# Patient Record
Sex: Female | Born: 1950
Health system: Southern US, Community
[De-identification: ages and names within clinical notes are randomized; demographics above are authoritative.]

## PROBLEM LIST (undated history)

## (undated) DIAGNOSIS — I10 Essential (primary) hypertension: Secondary | ICD-10-CM

## (undated) DIAGNOSIS — F32A Depression, unspecified: Secondary | ICD-10-CM

## (undated) DIAGNOSIS — F419 Anxiety disorder, unspecified: Secondary | ICD-10-CM

## (undated) DIAGNOSIS — G4730 Sleep apnea, unspecified: Secondary | ICD-10-CM

## (undated) DIAGNOSIS — G43909 Migraine, unspecified, not intractable, without status migrainosus: Secondary | ICD-10-CM

## (undated) DIAGNOSIS — K589 Irritable bowel syndrome without diarrhea: Secondary | ICD-10-CM

## (undated) DIAGNOSIS — M1990 Unspecified osteoarthritis, unspecified site: Secondary | ICD-10-CM

## (undated) DIAGNOSIS — J45909 Unspecified asthma, uncomplicated: Secondary | ICD-10-CM

## (undated) DIAGNOSIS — E7800 Pure hypercholesterolemia, unspecified: Secondary | ICD-10-CM

## (undated) DIAGNOSIS — R011 Cardiac murmur, unspecified: Secondary | ICD-10-CM

## (undated) DIAGNOSIS — E119 Type 2 diabetes mellitus without complications: Secondary | ICD-10-CM

## (undated) DIAGNOSIS — K2270 Barrett's esophagus without dysplasia: Secondary | ICD-10-CM

## (undated) DIAGNOSIS — F329 Major depressive disorder, single episode, unspecified: Secondary | ICD-10-CM

## (undated) DIAGNOSIS — J302 Other seasonal allergic rhinitis: Secondary | ICD-10-CM

## (undated) DIAGNOSIS — G629 Polyneuropathy, unspecified: Secondary | ICD-10-CM

## (undated) DIAGNOSIS — M8580 Other specified disorders of bone density and structure, unspecified site: Secondary | ICD-10-CM

## (undated) DIAGNOSIS — K219 Gastro-esophageal reflux disease without esophagitis: Secondary | ICD-10-CM

## (undated) HISTORY — PX: TUBAL LIGATION: SHX77

## (undated) HISTORY — PX: KNEE SURGERY: SHX244

## (undated) HISTORY — DX: Unspecified asthma, uncomplicated: J45.909

## (undated) HISTORY — DX: Gastro-esophageal reflux disease without esophagitis: K21.9

## (undated) HISTORY — DX: Essential (primary) hypertension: I10

## (undated) HISTORY — DX: Other seasonal allergic rhinitis: J30.2

## (undated) HISTORY — DX: Polyneuropathy, unspecified: G62.9

## (undated) HISTORY — DX: Migraine, unspecified, not intractable, without status migrainosus: G43.909

## (undated) HISTORY — DX: Anxiety disorder, unspecified: F41.9

## (undated) HISTORY — DX: Other specified disorders of bone density and structure, unspecified site: M85.80

## (undated) HISTORY — PX: SPINAL FUSION: SHX223

## (undated) HISTORY — PX: CATARACT EXTRACTION: SUR2

## (undated) HISTORY — PX: CHOLECYSTECTOMY: SHX55

## (undated) HISTORY — DX: Unspecified osteoarthritis, unspecified site: M19.90

## (undated) HISTORY — DX: Cardiac murmur, unspecified: R01.1

## (undated) HISTORY — PX: SHOULDER SURGERY: SHX246

## (undated) HISTORY — DX: Irritable bowel syndrome, unspecified: K58.9

## (undated) HISTORY — DX: Type 2 diabetes mellitus without complications: E11.9

## (undated) HISTORY — DX: Depression, unspecified: F32.A

## (undated) HISTORY — DX: Pure hypercholesterolemia, unspecified: E78.00

## (undated) HISTORY — DX: Barrett's esophagus without dysplasia: K22.70

## (undated) HISTORY — DX: Sleep apnea, unspecified: G47.30

---
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
## 2012-04-30 HISTORY — PX: COLONOSCOPY: SHX174

## 2015-04-14 DIAGNOSIS — Z23 Encounter for immunization: Secondary | ICD-10-CM | POA: Diagnosis not present

## 2015-04-20 DIAGNOSIS — M7500 Adhesive capsulitis of unspecified shoulder: Secondary | ICD-10-CM | POA: Diagnosis not present

## 2015-04-20 DIAGNOSIS — S43432A Superior glenoid labrum lesion of left shoulder, initial encounter: Secondary | ICD-10-CM | POA: Diagnosis not present

## 2015-04-20 DIAGNOSIS — M7542 Impingement syndrome of left shoulder: Secondary | ICD-10-CM | POA: Diagnosis not present

## 2015-04-21 DIAGNOSIS — M65812 Other synovitis and tenosynovitis, left shoulder: Secondary | ICD-10-CM | POA: Diagnosis not present

## 2015-04-21 DIAGNOSIS — E785 Hyperlipidemia, unspecified: Secondary | ICD-10-CM | POA: Diagnosis not present

## 2015-04-21 DIAGNOSIS — K219 Gastro-esophageal reflux disease without esophagitis: Secondary | ICD-10-CM | POA: Diagnosis not present

## 2015-04-21 DIAGNOSIS — G43909 Migraine, unspecified, not intractable, without status migrainosus: Secondary | ICD-10-CM | POA: Diagnosis not present

## 2015-04-21 DIAGNOSIS — S43432A Superior glenoid labrum lesion of left shoulder, initial encounter: Secondary | ICD-10-CM | POA: Diagnosis not present

## 2015-04-21 DIAGNOSIS — M24112 Other articular cartilage disorders, left shoulder: Secondary | ICD-10-CM | POA: Diagnosis not present

## 2015-04-21 DIAGNOSIS — M7502 Adhesive capsulitis of left shoulder: Secondary | ICD-10-CM | POA: Diagnosis not present

## 2015-04-21 DIAGNOSIS — G8918 Other acute postprocedural pain: Secondary | ICD-10-CM | POA: Diagnosis not present

## 2015-04-21 DIAGNOSIS — M7500 Adhesive capsulitis of unspecified shoulder: Secondary | ICD-10-CM | POA: Diagnosis not present

## 2015-04-21 DIAGNOSIS — X58XXXA Exposure to other specified factors, initial encounter: Secondary | ICD-10-CM | POA: Diagnosis not present

## 2015-04-21 DIAGNOSIS — G4730 Sleep apnea, unspecified: Secondary | ICD-10-CM | POA: Diagnosis not present

## 2015-04-21 DIAGNOSIS — M7542 Impingement syndrome of left shoulder: Secondary | ICD-10-CM | POA: Diagnosis not present

## 2015-04-21 DIAGNOSIS — J45909 Unspecified asthma, uncomplicated: Secondary | ICD-10-CM | POA: Diagnosis not present

## 2015-04-24 DIAGNOSIS — M6281 Muscle weakness (generalized): Secondary | ICD-10-CM | POA: Diagnosis not present

## 2015-04-24 DIAGNOSIS — M25612 Stiffness of left shoulder, not elsewhere classified: Secondary | ICD-10-CM | POA: Diagnosis not present

## 2015-04-24 DIAGNOSIS — S43432D Superior glenoid labrum lesion of left shoulder, subsequent encounter: Secondary | ICD-10-CM | POA: Diagnosis not present

## 2015-04-24 DIAGNOSIS — M25512 Pain in left shoulder: Secondary | ICD-10-CM | POA: Diagnosis not present

## 2015-04-28 DIAGNOSIS — M25512 Pain in left shoulder: Secondary | ICD-10-CM | POA: Diagnosis not present

## 2015-04-28 DIAGNOSIS — S43432D Superior glenoid labrum lesion of left shoulder, subsequent encounter: Secondary | ICD-10-CM | POA: Diagnosis not present

## 2015-04-28 DIAGNOSIS — M6281 Muscle weakness (generalized): Secondary | ICD-10-CM | POA: Diagnosis not present

## 2015-04-28 DIAGNOSIS — M25612 Stiffness of left shoulder, not elsewhere classified: Secondary | ICD-10-CM | POA: Diagnosis not present

## 2015-04-30 DIAGNOSIS — M25512 Pain in left shoulder: Secondary | ICD-10-CM | POA: Diagnosis not present

## 2015-04-30 DIAGNOSIS — M25612 Stiffness of left shoulder, not elsewhere classified: Secondary | ICD-10-CM | POA: Diagnosis not present

## 2015-04-30 DIAGNOSIS — M6281 Muscle weakness (generalized): Secondary | ICD-10-CM | POA: Diagnosis not present

## 2015-04-30 DIAGNOSIS — S43432D Superior glenoid labrum lesion of left shoulder, subsequent encounter: Secondary | ICD-10-CM | POA: Diagnosis not present

## 2015-05-05 DIAGNOSIS — M25612 Stiffness of left shoulder, not elsewhere classified: Secondary | ICD-10-CM | POA: Diagnosis not present

## 2015-05-05 DIAGNOSIS — R7302 Impaired glucose tolerance (oral): Secondary | ICD-10-CM | POA: Diagnosis not present

## 2015-05-05 DIAGNOSIS — M6281 Muscle weakness (generalized): Secondary | ICD-10-CM | POA: Diagnosis not present

## 2015-05-05 DIAGNOSIS — S43432D Superior glenoid labrum lesion of left shoulder, subsequent encounter: Secondary | ICD-10-CM | POA: Diagnosis not present

## 2015-05-05 DIAGNOSIS — M25512 Pain in left shoulder: Secondary | ICD-10-CM | POA: Diagnosis not present

## 2015-05-05 DIAGNOSIS — E782 Mixed hyperlipidemia: Secondary | ICD-10-CM | POA: Diagnosis not present

## 2015-05-07 DIAGNOSIS — S43432D Superior glenoid labrum lesion of left shoulder, subsequent encounter: Secondary | ICD-10-CM | POA: Diagnosis not present

## 2015-05-07 DIAGNOSIS — M25612 Stiffness of left shoulder, not elsewhere classified: Secondary | ICD-10-CM | POA: Diagnosis not present

## 2015-05-07 DIAGNOSIS — M6281 Muscle weakness (generalized): Secondary | ICD-10-CM | POA: Diagnosis not present

## 2015-05-07 DIAGNOSIS — M25512 Pain in left shoulder: Secondary | ICD-10-CM | POA: Diagnosis not present

## 2015-05-12 DIAGNOSIS — M6281 Muscle weakness (generalized): Secondary | ICD-10-CM | POA: Diagnosis not present

## 2015-05-12 DIAGNOSIS — S43432D Superior glenoid labrum lesion of left shoulder, subsequent encounter: Secondary | ICD-10-CM | POA: Diagnosis not present

## 2015-05-12 DIAGNOSIS — M25612 Stiffness of left shoulder, not elsewhere classified: Secondary | ICD-10-CM | POA: Diagnosis not present

## 2015-05-12 DIAGNOSIS — M25512 Pain in left shoulder: Secondary | ICD-10-CM | POA: Diagnosis not present

## 2015-05-13 DIAGNOSIS — E785 Hyperlipidemia, unspecified: Secondary | ICD-10-CM | POA: Diagnosis not present

## 2015-05-13 DIAGNOSIS — Z6830 Body mass index (BMI) 30.0-30.9, adult: Secondary | ICD-10-CM | POA: Diagnosis not present

## 2015-05-13 DIAGNOSIS — R69 Illness, unspecified: Secondary | ICD-10-CM | POA: Diagnosis not present

## 2015-05-13 DIAGNOSIS — R7303 Prediabetes: Secondary | ICD-10-CM | POA: Diagnosis not present

## 2015-05-14 DIAGNOSIS — M25612 Stiffness of left shoulder, not elsewhere classified: Secondary | ICD-10-CM | POA: Diagnosis not present

## 2015-05-14 DIAGNOSIS — S43432D Superior glenoid labrum lesion of left shoulder, subsequent encounter: Secondary | ICD-10-CM | POA: Diagnosis not present

## 2015-05-14 DIAGNOSIS — M25512 Pain in left shoulder: Secondary | ICD-10-CM | POA: Diagnosis not present

## 2015-05-14 DIAGNOSIS — M6281 Muscle weakness (generalized): Secondary | ICD-10-CM | POA: Diagnosis not present

## 2015-05-16 DIAGNOSIS — R69 Illness, unspecified: Secondary | ICD-10-CM | POA: Diagnosis not present

## 2015-05-18 DIAGNOSIS — M25612 Stiffness of left shoulder, not elsewhere classified: Secondary | ICD-10-CM | POA: Diagnosis not present

## 2015-05-18 DIAGNOSIS — M25512 Pain in left shoulder: Secondary | ICD-10-CM | POA: Diagnosis not present

## 2015-05-18 DIAGNOSIS — S43432D Superior glenoid labrum lesion of left shoulder, subsequent encounter: Secondary | ICD-10-CM | POA: Diagnosis not present

## 2015-05-18 DIAGNOSIS — M6281 Muscle weakness (generalized): Secondary | ICD-10-CM | POA: Diagnosis not present

## 2015-05-20 DIAGNOSIS — S43432D Superior glenoid labrum lesion of left shoulder, subsequent encounter: Secondary | ICD-10-CM | POA: Diagnosis not present

## 2015-05-20 DIAGNOSIS — M25512 Pain in left shoulder: Secondary | ICD-10-CM | POA: Diagnosis not present

## 2015-05-20 DIAGNOSIS — M6281 Muscle weakness (generalized): Secondary | ICD-10-CM | POA: Diagnosis not present

## 2015-05-20 DIAGNOSIS — M25612 Stiffness of left shoulder, not elsewhere classified: Secondary | ICD-10-CM | POA: Diagnosis not present

## 2015-05-26 DIAGNOSIS — M25512 Pain in left shoulder: Secondary | ICD-10-CM | POA: Diagnosis not present

## 2015-05-26 DIAGNOSIS — M25612 Stiffness of left shoulder, not elsewhere classified: Secondary | ICD-10-CM | POA: Diagnosis not present

## 2015-05-26 DIAGNOSIS — S43432D Superior glenoid labrum lesion of left shoulder, subsequent encounter: Secondary | ICD-10-CM | POA: Diagnosis not present

## 2015-05-26 DIAGNOSIS — M6281 Muscle weakness (generalized): Secondary | ICD-10-CM | POA: Diagnosis not present

## 2015-05-28 DIAGNOSIS — M25612 Stiffness of left shoulder, not elsewhere classified: Secondary | ICD-10-CM | POA: Diagnosis not present

## 2015-05-28 DIAGNOSIS — S43432D Superior glenoid labrum lesion of left shoulder, subsequent encounter: Secondary | ICD-10-CM | POA: Diagnosis not present

## 2015-05-28 DIAGNOSIS — M25512 Pain in left shoulder: Secondary | ICD-10-CM | POA: Diagnosis not present

## 2015-05-28 DIAGNOSIS — M6281 Muscle weakness (generalized): Secondary | ICD-10-CM | POA: Diagnosis not present

## 2015-05-30 DIAGNOSIS — Z23 Encounter for immunization: Secondary | ICD-10-CM | POA: Diagnosis not present

## 2015-06-02 DIAGNOSIS — S43432D Superior glenoid labrum lesion of left shoulder, subsequent encounter: Secondary | ICD-10-CM | POA: Diagnosis not present

## 2015-06-02 DIAGNOSIS — M6281 Muscle weakness (generalized): Secondary | ICD-10-CM | POA: Diagnosis not present

## 2015-06-02 DIAGNOSIS — M25512 Pain in left shoulder: Secondary | ICD-10-CM | POA: Diagnosis not present

## 2015-06-02 DIAGNOSIS — M25612 Stiffness of left shoulder, not elsewhere classified: Secondary | ICD-10-CM | POA: Diagnosis not present

## 2015-06-09 DIAGNOSIS — M25512 Pain in left shoulder: Secondary | ICD-10-CM | POA: Diagnosis not present

## 2015-06-09 DIAGNOSIS — S43432D Superior glenoid labrum lesion of left shoulder, subsequent encounter: Secondary | ICD-10-CM | POA: Diagnosis not present

## 2015-06-09 DIAGNOSIS — M25612 Stiffness of left shoulder, not elsewhere classified: Secondary | ICD-10-CM | POA: Diagnosis not present

## 2015-06-09 DIAGNOSIS — M6281 Muscle weakness (generalized): Secondary | ICD-10-CM | POA: Diagnosis not present

## 2015-06-11 DIAGNOSIS — M25512 Pain in left shoulder: Secondary | ICD-10-CM | POA: Diagnosis not present

## 2015-06-11 DIAGNOSIS — S43432D Superior glenoid labrum lesion of left shoulder, subsequent encounter: Secondary | ICD-10-CM | POA: Diagnosis not present

## 2015-06-11 DIAGNOSIS — M6281 Muscle weakness (generalized): Secondary | ICD-10-CM | POA: Diagnosis not present

## 2015-06-11 DIAGNOSIS — M25612 Stiffness of left shoulder, not elsewhere classified: Secondary | ICD-10-CM | POA: Diagnosis not present

## 2015-06-16 DIAGNOSIS — M25512 Pain in left shoulder: Secondary | ICD-10-CM | POA: Diagnosis not present

## 2015-06-16 DIAGNOSIS — M6281 Muscle weakness (generalized): Secondary | ICD-10-CM | POA: Diagnosis not present

## 2015-06-16 DIAGNOSIS — S43432D Superior glenoid labrum lesion of left shoulder, subsequent encounter: Secondary | ICD-10-CM | POA: Diagnosis not present

## 2015-06-16 DIAGNOSIS — M25612 Stiffness of left shoulder, not elsewhere classified: Secondary | ICD-10-CM | POA: Diagnosis not present

## 2015-06-18 DIAGNOSIS — S43432D Superior glenoid labrum lesion of left shoulder, subsequent encounter: Secondary | ICD-10-CM | POA: Diagnosis not present

## 2015-06-18 DIAGNOSIS — M25612 Stiffness of left shoulder, not elsewhere classified: Secondary | ICD-10-CM | POA: Diagnosis not present

## 2015-06-18 DIAGNOSIS — M25512 Pain in left shoulder: Secondary | ICD-10-CM | POA: Diagnosis not present

## 2015-06-18 DIAGNOSIS — M6281 Muscle weakness (generalized): Secondary | ICD-10-CM | POA: Diagnosis not present

## 2015-06-23 DIAGNOSIS — M25512 Pain in left shoulder: Secondary | ICD-10-CM | POA: Diagnosis not present

## 2015-06-23 DIAGNOSIS — M25612 Stiffness of left shoulder, not elsewhere classified: Secondary | ICD-10-CM | POA: Diagnosis not present

## 2015-06-23 DIAGNOSIS — S43432D Superior glenoid labrum lesion of left shoulder, subsequent encounter: Secondary | ICD-10-CM | POA: Diagnosis not present

## 2015-06-23 DIAGNOSIS — M6281 Muscle weakness (generalized): Secondary | ICD-10-CM | POA: Diagnosis not present

## 2015-06-25 DIAGNOSIS — M6281 Muscle weakness (generalized): Secondary | ICD-10-CM | POA: Diagnosis not present

## 2015-06-25 DIAGNOSIS — M25612 Stiffness of left shoulder, not elsewhere classified: Secondary | ICD-10-CM | POA: Diagnosis not present

## 2015-06-25 DIAGNOSIS — S43432D Superior glenoid labrum lesion of left shoulder, subsequent encounter: Secondary | ICD-10-CM | POA: Diagnosis not present

## 2015-06-25 DIAGNOSIS — M25512 Pain in left shoulder: Secondary | ICD-10-CM | POA: Diagnosis not present

## 2015-06-29 DIAGNOSIS — M25512 Pain in left shoulder: Secondary | ICD-10-CM | POA: Diagnosis not present

## 2015-06-29 DIAGNOSIS — S43432D Superior glenoid labrum lesion of left shoulder, subsequent encounter: Secondary | ICD-10-CM | POA: Diagnosis not present

## 2015-06-29 DIAGNOSIS — M6281 Muscle weakness (generalized): Secondary | ICD-10-CM | POA: Diagnosis not present

## 2015-06-29 DIAGNOSIS — M25612 Stiffness of left shoulder, not elsewhere classified: Secondary | ICD-10-CM | POA: Diagnosis not present

## 2015-07-01 DIAGNOSIS — S43432D Superior glenoid labrum lesion of left shoulder, subsequent encounter: Secondary | ICD-10-CM | POA: Diagnosis not present

## 2015-07-01 DIAGNOSIS — M25512 Pain in left shoulder: Secondary | ICD-10-CM | POA: Diagnosis not present

## 2015-07-01 DIAGNOSIS — M6281 Muscle weakness (generalized): Secondary | ICD-10-CM | POA: Diagnosis not present

## 2015-07-01 DIAGNOSIS — M25612 Stiffness of left shoulder, not elsewhere classified: Secondary | ICD-10-CM | POA: Diagnosis not present

## 2015-07-22 DIAGNOSIS — M7542 Impingement syndrome of left shoulder: Secondary | ICD-10-CM | POA: Diagnosis not present

## 2015-07-22 DIAGNOSIS — Z9889 Other specified postprocedural states: Secondary | ICD-10-CM | POA: Diagnosis not present

## 2015-07-25 DIAGNOSIS — J019 Acute sinusitis, unspecified: Secondary | ICD-10-CM | POA: Diagnosis not present

## 2015-08-16 DIAGNOSIS — R69 Illness, unspecified: Secondary | ICD-10-CM | POA: Diagnosis not present

## 2015-09-03 DIAGNOSIS — G4733 Obstructive sleep apnea (adult) (pediatric): Secondary | ICD-10-CM | POA: Diagnosis not present

## 2015-09-03 DIAGNOSIS — G603 Idiopathic progressive neuropathy: Secondary | ICD-10-CM | POA: Diagnosis not present

## 2015-09-03 DIAGNOSIS — G43009 Migraine without aura, not intractable, without status migrainosus: Secondary | ICD-10-CM | POA: Diagnosis not present

## 2015-10-25 DIAGNOSIS — G4733 Obstructive sleep apnea (adult) (pediatric): Secondary | ICD-10-CM | POA: Diagnosis not present

## 2015-10-27 DIAGNOSIS — G4733 Obstructive sleep apnea (adult) (pediatric): Secondary | ICD-10-CM | POA: Diagnosis not present

## 2015-10-27 DIAGNOSIS — G4761 Periodic limb movement disorder: Secondary | ICD-10-CM | POA: Diagnosis not present

## 2015-10-27 DIAGNOSIS — J4520 Mild intermittent asthma, uncomplicated: Secondary | ICD-10-CM | POA: Diagnosis not present

## 2015-10-27 DIAGNOSIS — R5383 Other fatigue: Secondary | ICD-10-CM | POA: Diagnosis not present

## 2015-11-02 DIAGNOSIS — G4733 Obstructive sleep apnea (adult) (pediatric): Secondary | ICD-10-CM | POA: Diagnosis not present

## 2016-02-02 DIAGNOSIS — R7303 Prediabetes: Secondary | ICD-10-CM | POA: Diagnosis not present

## 2016-02-02 DIAGNOSIS — E782 Mixed hyperlipidemia: Secondary | ICD-10-CM | POA: Diagnosis not present

## 2016-02-10 DIAGNOSIS — E782 Mixed hyperlipidemia: Secondary | ICD-10-CM | POA: Diagnosis not present

## 2016-02-10 DIAGNOSIS — R69 Illness, unspecified: Secondary | ICD-10-CM | POA: Diagnosis not present

## 2016-02-10 DIAGNOSIS — R7303 Prediabetes: Secondary | ICD-10-CM | POA: Diagnosis not present

## 2016-02-10 DIAGNOSIS — Z1389 Encounter for screening for other disorder: Secondary | ICD-10-CM | POA: Diagnosis not present

## 2016-03-09 DIAGNOSIS — R5383 Other fatigue: Secondary | ICD-10-CM | POA: Diagnosis not present

## 2016-03-09 DIAGNOSIS — J4520 Mild intermittent asthma, uncomplicated: Secondary | ICD-10-CM | POA: Diagnosis not present

## 2016-03-09 DIAGNOSIS — G4761 Periodic limb movement disorder: Secondary | ICD-10-CM | POA: Diagnosis not present

## 2016-03-09 DIAGNOSIS — G4733 Obstructive sleep apnea (adult) (pediatric): Secondary | ICD-10-CM | POA: Diagnosis not present

## 2016-03-10 DIAGNOSIS — G4733 Obstructive sleep apnea (adult) (pediatric): Secondary | ICD-10-CM | POA: Diagnosis not present

## 2016-03-11 DIAGNOSIS — M818 Other osteoporosis without current pathological fracture: Secondary | ICD-10-CM | POA: Diagnosis not present

## 2016-03-11 DIAGNOSIS — M8588 Other specified disorders of bone density and structure, other site: Secondary | ICD-10-CM | POA: Diagnosis not present

## 2016-03-11 DIAGNOSIS — Z1231 Encounter for screening mammogram for malignant neoplasm of breast: Secondary | ICD-10-CM | POA: Diagnosis not present

## 2016-04-19 DIAGNOSIS — G4733 Obstructive sleep apnea (adult) (pediatric): Secondary | ICD-10-CM | POA: Diagnosis not present

## 2016-04-20 DIAGNOSIS — G4733 Obstructive sleep apnea (adult) (pediatric): Secondary | ICD-10-CM | POA: Diagnosis not present

## 2016-04-20 DIAGNOSIS — J4520 Mild intermittent asthma, uncomplicated: Secondary | ICD-10-CM | POA: Diagnosis not present

## 2016-04-20 DIAGNOSIS — R5383 Other fatigue: Secondary | ICD-10-CM | POA: Diagnosis not present

## 2016-04-21 DIAGNOSIS — Z23 Encounter for immunization: Secondary | ICD-10-CM | POA: Diagnosis not present

## 2016-06-15 DIAGNOSIS — E782 Mixed hyperlipidemia: Secondary | ICD-10-CM | POA: Diagnosis not present

## 2016-06-15 DIAGNOSIS — R7303 Prediabetes: Secondary | ICD-10-CM | POA: Diagnosis not present

## 2016-06-23 DIAGNOSIS — E782 Mixed hyperlipidemia: Secondary | ICD-10-CM | POA: Diagnosis not present

## 2016-06-23 DIAGNOSIS — Z6830 Body mass index (BMI) 30.0-30.9, adult: Secondary | ICD-10-CM | POA: Diagnosis not present

## 2016-06-23 DIAGNOSIS — R69 Illness, unspecified: Secondary | ICD-10-CM | POA: Diagnosis not present

## 2016-06-29 DIAGNOSIS — J0100 Acute maxillary sinusitis, unspecified: Secondary | ICD-10-CM | POA: Diagnosis not present

## 2016-06-29 DIAGNOSIS — J209 Acute bronchitis, unspecified: Secondary | ICD-10-CM | POA: Diagnosis not present

## 2016-06-30 DIAGNOSIS — G4733 Obstructive sleep apnea (adult) (pediatric): Secondary | ICD-10-CM | POA: Diagnosis not present

## 2016-08-08 DIAGNOSIS — G479 Sleep disorder, unspecified: Secondary | ICD-10-CM | POA: Diagnosis not present

## 2016-08-08 DIAGNOSIS — G43009 Migraine without aura, not intractable, without status migrainosus: Secondary | ICD-10-CM | POA: Diagnosis not present

## 2016-08-18 DIAGNOSIS — J309 Allergic rhinitis, unspecified: Secondary | ICD-10-CM | POA: Diagnosis not present

## 2016-09-08 DIAGNOSIS — K219 Gastro-esophageal reflux disease without esophagitis: Secondary | ICD-10-CM | POA: Diagnosis not present

## 2016-09-08 DIAGNOSIS — K2270 Barrett's esophagus without dysplasia: Secondary | ICD-10-CM | POA: Diagnosis not present

## 2016-09-09 DIAGNOSIS — M7541 Impingement syndrome of right shoulder: Secondary | ICD-10-CM | POA: Diagnosis not present

## 2016-09-09 DIAGNOSIS — M25511 Pain in right shoulder: Secondary | ICD-10-CM | POA: Diagnosis not present

## 2016-09-15 DIAGNOSIS — M7541 Impingement syndrome of right shoulder: Secondary | ICD-10-CM | POA: Diagnosis not present

## 2016-09-15 DIAGNOSIS — M25511 Pain in right shoulder: Secondary | ICD-10-CM | POA: Diagnosis not present

## 2016-09-15 DIAGNOSIS — M25611 Stiffness of right shoulder, not elsewhere classified: Secondary | ICD-10-CM | POA: Diagnosis not present

## 2016-09-20 DIAGNOSIS — M7541 Impingement syndrome of right shoulder: Secondary | ICD-10-CM | POA: Diagnosis not present

## 2016-09-20 DIAGNOSIS — M25511 Pain in right shoulder: Secondary | ICD-10-CM | POA: Diagnosis not present

## 2016-09-20 DIAGNOSIS — M25611 Stiffness of right shoulder, not elsewhere classified: Secondary | ICD-10-CM | POA: Diagnosis not present

## 2016-09-23 DIAGNOSIS — M7541 Impingement syndrome of right shoulder: Secondary | ICD-10-CM | POA: Diagnosis not present

## 2016-09-23 DIAGNOSIS — M25511 Pain in right shoulder: Secondary | ICD-10-CM | POA: Diagnosis not present

## 2016-09-23 DIAGNOSIS — M25611 Stiffness of right shoulder, not elsewhere classified: Secondary | ICD-10-CM | POA: Diagnosis not present

## 2016-09-28 DIAGNOSIS — M25611 Stiffness of right shoulder, not elsewhere classified: Secondary | ICD-10-CM | POA: Diagnosis not present

## 2016-09-28 DIAGNOSIS — M25511 Pain in right shoulder: Secondary | ICD-10-CM | POA: Diagnosis not present

## 2016-09-28 DIAGNOSIS — M7541 Impingement syndrome of right shoulder: Secondary | ICD-10-CM | POA: Diagnosis not present

## 2016-09-30 DIAGNOSIS — M7541 Impingement syndrome of right shoulder: Secondary | ICD-10-CM | POA: Diagnosis not present

## 2016-09-30 DIAGNOSIS — M25611 Stiffness of right shoulder, not elsewhere classified: Secondary | ICD-10-CM | POA: Diagnosis not present

## 2016-09-30 DIAGNOSIS — M25511 Pain in right shoulder: Secondary | ICD-10-CM | POA: Diagnosis not present

## 2016-10-05 DIAGNOSIS — K2270 Barrett's esophagus without dysplasia: Secondary | ICD-10-CM | POA: Diagnosis not present

## 2016-10-05 DIAGNOSIS — K2900 Acute gastritis without bleeding: Secondary | ICD-10-CM | POA: Diagnosis not present

## 2016-10-05 DIAGNOSIS — K449 Diaphragmatic hernia without obstruction or gangrene: Secondary | ICD-10-CM | POA: Diagnosis not present

## 2016-10-05 DIAGNOSIS — K228 Other specified diseases of esophagus: Secondary | ICD-10-CM | POA: Diagnosis not present

## 2016-10-05 DIAGNOSIS — K219 Gastro-esophageal reflux disease without esophagitis: Secondary | ICD-10-CM | POA: Diagnosis not present

## 2016-10-05 HISTORY — PX: ESOPHAGOGASTRODUODENOSCOPY: SHX1529

## 2016-10-10 DIAGNOSIS — M720 Palmar fascial fibromatosis [Dupuytren]: Secondary | ICD-10-CM | POA: Diagnosis not present

## 2016-10-10 DIAGNOSIS — M7541 Impingement syndrome of right shoulder: Secondary | ICD-10-CM | POA: Diagnosis not present

## 2016-10-26 DIAGNOSIS — J4520 Mild intermittent asthma, uncomplicated: Secondary | ICD-10-CM | POA: Diagnosis not present

## 2016-10-26 DIAGNOSIS — G4733 Obstructive sleep apnea (adult) (pediatric): Secondary | ICD-10-CM | POA: Diagnosis not present

## 2016-10-26 DIAGNOSIS — R5383 Other fatigue: Secondary | ICD-10-CM | POA: Diagnosis not present

## 2016-10-26 DIAGNOSIS — G4761 Periodic limb movement disorder: Secondary | ICD-10-CM | POA: Diagnosis not present

## 2016-11-18 DIAGNOSIS — G4733 Obstructive sleep apnea (adult) (pediatric): Secondary | ICD-10-CM | POA: Diagnosis not present

## 2016-11-30 DIAGNOSIS — R7303 Prediabetes: Secondary | ICD-10-CM | POA: Diagnosis not present

## 2016-11-30 DIAGNOSIS — E782 Mixed hyperlipidemia: Secondary | ICD-10-CM | POA: Diagnosis not present

## 2016-12-07 DIAGNOSIS — R7303 Prediabetes: Secondary | ICD-10-CM | POA: Diagnosis not present

## 2016-12-07 DIAGNOSIS — Z139 Encounter for screening, unspecified: Secondary | ICD-10-CM | POA: Diagnosis not present

## 2016-12-07 DIAGNOSIS — Z6830 Body mass index (BMI) 30.0-30.9, adult: Secondary | ICD-10-CM | POA: Diagnosis not present

## 2016-12-07 DIAGNOSIS — Z0000 Encounter for general adult medical examination without abnormal findings: Secondary | ICD-10-CM | POA: Diagnosis not present

## 2016-12-07 DIAGNOSIS — Z1389 Encounter for screening for other disorder: Secondary | ICD-10-CM | POA: Diagnosis not present

## 2016-12-07 DIAGNOSIS — F325 Major depressive disorder, single episode, in full remission: Secondary | ICD-10-CM | POA: Diagnosis not present

## 2016-12-07 DIAGNOSIS — R69 Illness, unspecified: Secondary | ICD-10-CM | POA: Diagnosis not present

## 2016-12-07 DIAGNOSIS — E782 Mixed hyperlipidemia: Secondary | ICD-10-CM | POA: Diagnosis not present

## 2017-01-24 DIAGNOSIS — G43009 Migraine without aura, not intractable, without status migrainosus: Secondary | ICD-10-CM | POA: Diagnosis not present

## 2017-01-24 DIAGNOSIS — M5416 Radiculopathy, lumbar region: Secondary | ICD-10-CM | POA: Diagnosis not present

## 2017-04-05 DIAGNOSIS — G4733 Obstructive sleep apnea (adult) (pediatric): Secondary | ICD-10-CM | POA: Diagnosis not present

## 2017-04-11 DIAGNOSIS — J0100 Acute maxillary sinusitis, unspecified: Secondary | ICD-10-CM | POA: Diagnosis not present

## 2017-04-11 DIAGNOSIS — J209 Acute bronchitis, unspecified: Secondary | ICD-10-CM | POA: Diagnosis not present

## 2017-04-21 DIAGNOSIS — G4733 Obstructive sleep apnea (adult) (pediatric): Secondary | ICD-10-CM | POA: Diagnosis not present

## 2017-04-25 DIAGNOSIS — R69 Illness, unspecified: Secondary | ICD-10-CM | POA: Diagnosis not present

## 2017-04-25 DIAGNOSIS — G43709 Chronic migraine without aura, not intractable, without status migrainosus: Secondary | ICD-10-CM | POA: Diagnosis not present

## 2017-04-26 DIAGNOSIS — G4733 Obstructive sleep apnea (adult) (pediatric): Secondary | ICD-10-CM | POA: Diagnosis not present

## 2017-04-26 DIAGNOSIS — G4761 Periodic limb movement disorder: Secondary | ICD-10-CM | POA: Diagnosis not present

## 2017-04-26 DIAGNOSIS — J4520 Mild intermittent asthma, uncomplicated: Secondary | ICD-10-CM | POA: Diagnosis not present

## 2017-04-26 DIAGNOSIS — R69 Illness, unspecified: Secondary | ICD-10-CM | POA: Diagnosis not present

## 2017-05-23 DIAGNOSIS — R69 Illness, unspecified: Secondary | ICD-10-CM | POA: Diagnosis not present

## 2017-05-31 DIAGNOSIS — G4733 Obstructive sleep apnea (adult) (pediatric): Secondary | ICD-10-CM | POA: Diagnosis not present

## 2017-05-31 DIAGNOSIS — J45991 Cough variant asthma: Secondary | ICD-10-CM | POA: Diagnosis not present

## 2017-05-31 DIAGNOSIS — G4761 Periodic limb movement disorder: Secondary | ICD-10-CM | POA: Diagnosis not present

## 2017-05-31 DIAGNOSIS — R5383 Other fatigue: Secondary | ICD-10-CM | POA: Diagnosis not present

## 2017-06-08 DIAGNOSIS — E782 Mixed hyperlipidemia: Secondary | ICD-10-CM | POA: Diagnosis not present

## 2017-06-08 DIAGNOSIS — R7303 Prediabetes: Secondary | ICD-10-CM | POA: Diagnosis not present

## 2017-07-08 DIAGNOSIS — G4733 Obstructive sleep apnea (adult) (pediatric): Secondary | ICD-10-CM | POA: Diagnosis not present

## 2017-07-08 DIAGNOSIS — J449 Chronic obstructive pulmonary disease, unspecified: Secondary | ICD-10-CM | POA: Diagnosis not present

## 2017-07-13 DIAGNOSIS — Z1231 Encounter for screening mammogram for malignant neoplasm of breast: Secondary | ICD-10-CM | POA: Diagnosis not present

## 2017-07-25 DIAGNOSIS — G43709 Chronic migraine without aura, not intractable, without status migrainosus: Secondary | ICD-10-CM | POA: Diagnosis not present

## 2017-08-13 DIAGNOSIS — J0100 Acute maxillary sinusitis, unspecified: Secondary | ICD-10-CM | POA: Diagnosis not present

## 2017-09-05 DIAGNOSIS — G4733 Obstructive sleep apnea (adult) (pediatric): Secondary | ICD-10-CM | POA: Diagnosis not present

## 2017-09-05 DIAGNOSIS — G4761 Periodic limb movement disorder: Secondary | ICD-10-CM | POA: Diagnosis not present

## 2017-09-05 DIAGNOSIS — J45991 Cough variant asthma: Secondary | ICD-10-CM | POA: Diagnosis not present

## 2017-09-05 DIAGNOSIS — R69 Illness, unspecified: Secondary | ICD-10-CM | POA: Diagnosis not present

## 2017-09-05 DIAGNOSIS — R5383 Other fatigue: Secondary | ICD-10-CM | POA: Diagnosis not present

## 2017-09-13 DIAGNOSIS — R7303 Prediabetes: Secondary | ICD-10-CM | POA: Diagnosis not present

## 2017-09-13 DIAGNOSIS — E559 Vitamin D deficiency, unspecified: Secondary | ICD-10-CM | POA: Diagnosis not present

## 2017-09-13 DIAGNOSIS — E782 Mixed hyperlipidemia: Secondary | ICD-10-CM | POA: Diagnosis not present

## 2017-09-20 DIAGNOSIS — R69 Illness, unspecified: Secondary | ICD-10-CM | POA: Diagnosis not present

## 2017-10-02 DIAGNOSIS — Z0000 Encounter for general adult medical examination without abnormal findings: Secondary | ICD-10-CM | POA: Diagnosis not present

## 2017-10-02 DIAGNOSIS — R69 Illness, unspecified: Secondary | ICD-10-CM | POA: Diagnosis not present

## 2017-10-02 DIAGNOSIS — Z1331 Encounter for screening for depression: Secondary | ICD-10-CM | POA: Diagnosis not present

## 2017-10-02 DIAGNOSIS — Z9181 History of falling: Secondary | ICD-10-CM | POA: Diagnosis not present

## 2017-10-02 DIAGNOSIS — E782 Mixed hyperlipidemia: Secondary | ICD-10-CM | POA: Diagnosis not present

## 2017-10-02 DIAGNOSIS — F3130 Bipolar disorder, current episode depressed, mild or moderate severity, unspecified: Secondary | ICD-10-CM | POA: Diagnosis not present

## 2017-10-02 DIAGNOSIS — Z139 Encounter for screening, unspecified: Secondary | ICD-10-CM | POA: Diagnosis not present

## 2017-10-02 DIAGNOSIS — Z6828 Body mass index (BMI) 28.0-28.9, adult: Secondary | ICD-10-CM | POA: Diagnosis not present

## 2017-10-02 DIAGNOSIS — E663 Overweight: Secondary | ICD-10-CM | POA: Diagnosis not present

## 2017-10-10 DIAGNOSIS — G4733 Obstructive sleep apnea (adult) (pediatric): Secondary | ICD-10-CM | POA: Diagnosis not present

## 2017-10-10 DIAGNOSIS — J449 Chronic obstructive pulmonary disease, unspecified: Secondary | ICD-10-CM | POA: Diagnosis not present

## 2017-10-24 DIAGNOSIS — G43709 Chronic migraine without aura, not intractable, without status migrainosus: Secondary | ICD-10-CM | POA: Diagnosis not present

## 2017-11-28 DIAGNOSIS — G4733 Obstructive sleep apnea (adult) (pediatric): Secondary | ICD-10-CM | POA: Diagnosis not present

## 2017-11-28 DIAGNOSIS — R69 Illness, unspecified: Secondary | ICD-10-CM | POA: Diagnosis not present

## 2017-11-28 DIAGNOSIS — J45991 Cough variant asthma: Secondary | ICD-10-CM | POA: Diagnosis not present

## 2017-11-28 DIAGNOSIS — R5383 Other fatigue: Secondary | ICD-10-CM | POA: Diagnosis not present

## 2018-01-05 DIAGNOSIS — G4733 Obstructive sleep apnea (adult) (pediatric): Secondary | ICD-10-CM | POA: Diagnosis not present

## 2018-01-05 DIAGNOSIS — J449 Chronic obstructive pulmonary disease, unspecified: Secondary | ICD-10-CM | POA: Diagnosis not present

## 2018-02-26 DIAGNOSIS — R7303 Prediabetes: Secondary | ICD-10-CM | POA: Diagnosis not present

## 2018-02-26 DIAGNOSIS — E782 Mixed hyperlipidemia: Secondary | ICD-10-CM | POA: Diagnosis not present

## 2018-03-05 DIAGNOSIS — R7303 Prediabetes: Secondary | ICD-10-CM | POA: Diagnosis not present

## 2018-03-05 DIAGNOSIS — E782 Mixed hyperlipidemia: Secondary | ICD-10-CM | POA: Diagnosis not present

## 2018-03-05 DIAGNOSIS — R69 Illness, unspecified: Secondary | ICD-10-CM | POA: Diagnosis not present

## 2018-03-05 DIAGNOSIS — Z23 Encounter for immunization: Secondary | ICD-10-CM | POA: Diagnosis not present

## 2018-03-27 DIAGNOSIS — G479 Sleep disorder, unspecified: Secondary | ICD-10-CM | POA: Diagnosis not present

## 2018-03-27 DIAGNOSIS — G43009 Migraine without aura, not intractable, without status migrainosus: Secondary | ICD-10-CM | POA: Diagnosis not present

## 2018-04-02 DIAGNOSIS — G4733 Obstructive sleep apnea (adult) (pediatric): Secondary | ICD-10-CM | POA: Diagnosis not present

## 2018-04-03 DIAGNOSIS — G4733 Obstructive sleep apnea (adult) (pediatric): Secondary | ICD-10-CM | POA: Diagnosis not present

## 2018-04-03 DIAGNOSIS — J4520 Mild intermittent asthma, uncomplicated: Secondary | ICD-10-CM | POA: Diagnosis not present

## 2018-04-03 DIAGNOSIS — G4761 Periodic limb movement disorder: Secondary | ICD-10-CM | POA: Diagnosis not present

## 2018-04-03 DIAGNOSIS — R69 Illness, unspecified: Secondary | ICD-10-CM | POA: Diagnosis not present

## 2018-04-12 DIAGNOSIS — Z6828 Body mass index (BMI) 28.0-28.9, adult: Secondary | ICD-10-CM | POA: Diagnosis not present

## 2018-04-12 DIAGNOSIS — E663 Overweight: Secondary | ICD-10-CM | POA: Diagnosis not present

## 2018-04-19 DIAGNOSIS — Z6828 Body mass index (BMI) 28.0-28.9, adult: Secondary | ICD-10-CM | POA: Diagnosis not present

## 2018-04-19 DIAGNOSIS — E663 Overweight: Secondary | ICD-10-CM | POA: Diagnosis not present

## 2018-04-26 DIAGNOSIS — E663 Overweight: Secondary | ICD-10-CM | POA: Diagnosis not present

## 2018-04-26 DIAGNOSIS — Z6828 Body mass index (BMI) 28.0-28.9, adult: Secondary | ICD-10-CM | POA: Diagnosis not present

## 2018-05-03 DIAGNOSIS — E663 Overweight: Secondary | ICD-10-CM | POA: Diagnosis not present

## 2018-05-03 DIAGNOSIS — Z6828 Body mass index (BMI) 28.0-28.9, adult: Secondary | ICD-10-CM | POA: Diagnosis not present

## 2018-05-09 DIAGNOSIS — J0100 Acute maxillary sinusitis, unspecified: Secondary | ICD-10-CM | POA: Diagnosis not present

## 2018-05-09 DIAGNOSIS — R05 Cough: Secondary | ICD-10-CM | POA: Diagnosis not present

## 2018-05-17 DIAGNOSIS — Z6828 Body mass index (BMI) 28.0-28.9, adult: Secondary | ICD-10-CM | POA: Diagnosis not present

## 2018-05-31 DIAGNOSIS — Z6828 Body mass index (BMI) 28.0-28.9, adult: Secondary | ICD-10-CM | POA: Diagnosis not present

## 2018-05-31 DIAGNOSIS — E663 Overweight: Secondary | ICD-10-CM | POA: Diagnosis not present

## 2018-06-14 DIAGNOSIS — E663 Overweight: Secondary | ICD-10-CM | POA: Diagnosis not present

## 2018-06-14 DIAGNOSIS — Z6828 Body mass index (BMI) 28.0-28.9, adult: Secondary | ICD-10-CM | POA: Diagnosis not present

## 2018-06-15 DIAGNOSIS — J449 Chronic obstructive pulmonary disease, unspecified: Secondary | ICD-10-CM | POA: Diagnosis not present

## 2018-06-15 DIAGNOSIS — G4733 Obstructive sleep apnea (adult) (pediatric): Secondary | ICD-10-CM | POA: Diagnosis not present

## 2018-06-17 DIAGNOSIS — J209 Acute bronchitis, unspecified: Secondary | ICD-10-CM | POA: Diagnosis not present

## 2018-06-17 DIAGNOSIS — J45909 Unspecified asthma, uncomplicated: Secondary | ICD-10-CM | POA: Diagnosis not present

## 2018-06-25 DIAGNOSIS — R7303 Prediabetes: Secondary | ICD-10-CM | POA: Diagnosis not present

## 2018-06-25 DIAGNOSIS — E782 Mixed hyperlipidemia: Secondary | ICD-10-CM | POA: Diagnosis not present

## 2018-06-28 DIAGNOSIS — E663 Overweight: Secondary | ICD-10-CM | POA: Diagnosis not present

## 2018-06-28 DIAGNOSIS — Z6828 Body mass index (BMI) 28.0-28.9, adult: Secondary | ICD-10-CM | POA: Diagnosis not present

## 2018-06-28 DIAGNOSIS — R7303 Prediabetes: Secondary | ICD-10-CM | POA: Diagnosis not present

## 2018-07-09 DIAGNOSIS — R69 Illness, unspecified: Secondary | ICD-10-CM | POA: Diagnosis not present

## 2018-07-09 DIAGNOSIS — E782 Mixed hyperlipidemia: Secondary | ICD-10-CM | POA: Diagnosis not present

## 2018-07-09 DIAGNOSIS — R7303 Prediabetes: Secondary | ICD-10-CM | POA: Diagnosis not present

## 2018-07-09 DIAGNOSIS — Z139 Encounter for screening, unspecified: Secondary | ICD-10-CM | POA: Diagnosis not present

## 2018-07-24 DIAGNOSIS — M1712 Unilateral primary osteoarthritis, left knee: Secondary | ICD-10-CM | POA: Diagnosis not present

## 2018-08-09 DIAGNOSIS — R7303 Prediabetes: Secondary | ICD-10-CM | POA: Diagnosis not present

## 2018-08-13 DIAGNOSIS — Z139 Encounter for screening, unspecified: Secondary | ICD-10-CM | POA: Diagnosis not present

## 2018-08-13 DIAGNOSIS — Z0000 Encounter for general adult medical examination without abnormal findings: Secondary | ICD-10-CM | POA: Diagnosis not present

## 2018-08-13 DIAGNOSIS — Z6827 Body mass index (BMI) 27.0-27.9, adult: Secondary | ICD-10-CM | POA: Diagnosis not present

## 2018-08-13 DIAGNOSIS — F325 Major depressive disorder, single episode, in full remission: Secondary | ICD-10-CM | POA: Diagnosis not present

## 2018-08-18 DIAGNOSIS — M65862 Other synovitis and tenosynovitis, left lower leg: Secondary | ICD-10-CM | POA: Diagnosis not present

## 2018-08-18 DIAGNOSIS — M25562 Pain in left knee: Secondary | ICD-10-CM | POA: Diagnosis not present

## 2018-08-18 DIAGNOSIS — M25462 Effusion, left knee: Secondary | ICD-10-CM | POA: Diagnosis not present

## 2018-08-20 DIAGNOSIS — M1712 Unilateral primary osteoarthritis, left knee: Secondary | ICD-10-CM | POA: Diagnosis not present

## 2018-08-20 DIAGNOSIS — S83242A Other tear of medial meniscus, current injury, left knee, initial encounter: Secondary | ICD-10-CM | POA: Diagnosis not present

## 2018-08-21 DIAGNOSIS — G479 Sleep disorder, unspecified: Secondary | ICD-10-CM | POA: Diagnosis not present

## 2018-08-21 DIAGNOSIS — G43009 Migraine without aura, not intractable, without status migrainosus: Secondary | ICD-10-CM | POA: Diagnosis not present

## 2018-08-29 DIAGNOSIS — Z1231 Encounter for screening mammogram for malignant neoplasm of breast: Secondary | ICD-10-CM | POA: Diagnosis not present

## 2018-10-26 DIAGNOSIS — G4733 Obstructive sleep apnea (adult) (pediatric): Secondary | ICD-10-CM | POA: Diagnosis not present

## 2018-10-26 DIAGNOSIS — J449 Chronic obstructive pulmonary disease, unspecified: Secondary | ICD-10-CM | POA: Diagnosis not present

## 2018-12-19 DIAGNOSIS — H25013 Cortical age-related cataract, bilateral: Secondary | ICD-10-CM | POA: Diagnosis not present

## 2018-12-19 DIAGNOSIS — H524 Presbyopia: Secondary | ICD-10-CM | POA: Diagnosis not present

## 2018-12-19 DIAGNOSIS — Q141 Congenital malformation of retina: Secondary | ICD-10-CM | POA: Diagnosis not present

## 2019-02-11 DIAGNOSIS — R7303 Prediabetes: Secondary | ICD-10-CM | POA: Diagnosis not present

## 2019-02-11 DIAGNOSIS — E782 Mixed hyperlipidemia: Secondary | ICD-10-CM | POA: Diagnosis not present

## 2019-02-18 DIAGNOSIS — E782 Mixed hyperlipidemia: Secondary | ICD-10-CM | POA: Diagnosis not present

## 2019-02-18 DIAGNOSIS — R7303 Prediabetes: Secondary | ICD-10-CM | POA: Diagnosis not present

## 2019-02-18 DIAGNOSIS — F339 Major depressive disorder, recurrent, unspecified: Secondary | ICD-10-CM | POA: Diagnosis not present

## 2019-02-18 DIAGNOSIS — Z6827 Body mass index (BMI) 27.0-27.9, adult: Secondary | ICD-10-CM | POA: Diagnosis not present

## 2019-03-04 ENCOUNTER — Encounter: Payer: Self-pay | Admitting: Gastroenterology

## 2019-03-05 ENCOUNTER — Encounter: Payer: Self-pay | Admitting: Gastroenterology

## 2019-03-13 ENCOUNTER — Encounter: Payer: Self-pay | Admitting: Gastroenterology

## 2019-03-14 DIAGNOSIS — G4733 Obstructive sleep apnea (adult) (pediatric): Secondary | ICD-10-CM | POA: Diagnosis not present

## 2019-03-19 DIAGNOSIS — G479 Sleep disorder, unspecified: Secondary | ICD-10-CM | POA: Diagnosis not present

## 2019-03-19 DIAGNOSIS — G43009 Migraine without aura, not intractable, without status migrainosus: Secondary | ICD-10-CM | POA: Diagnosis not present

## 2019-03-27 ENCOUNTER — Ambulatory Visit: Payer: Self-pay | Admitting: Gastroenterology

## 2019-04-15 DIAGNOSIS — J188 Other pneumonia, unspecified organism: Secondary | ICD-10-CM | POA: Diagnosis not present

## 2019-04-15 DIAGNOSIS — R05 Cough: Secondary | ICD-10-CM | POA: Diagnosis not present

## 2019-04-15 DIAGNOSIS — B349 Viral infection, unspecified: Secondary | ICD-10-CM | POA: Diagnosis not present

## 2019-05-01 ENCOUNTER — Encounter: Payer: Self-pay | Admitting: Gastroenterology

## 2019-05-01 ENCOUNTER — Other Ambulatory Visit: Payer: Self-pay

## 2019-05-01 ENCOUNTER — Ambulatory Visit: Payer: PPO | Admitting: Gastroenterology

## 2019-05-01 VITALS — BP 102/70 | HR 86 | Temp 97.5°F | Ht 61.0 in | Wt 148.1 lb

## 2019-05-01 DIAGNOSIS — K219 Gastro-esophageal reflux disease without esophagitis: Secondary | ICD-10-CM

## 2019-05-01 MED ORDER — LANSOPRAZOLE 30 MG PO CPDR: 30.0000 mg | DELAYED_RELEASE_CAPSULE | Freq: Two times a day (BID) | ORAL | 6 refills | Status: DC

## 2019-05-01 NOTE — Patient Instructions (Signed)
If you are age 67 or older, your body mass index should be between 23-30. Your Body mass index is 27.99 kg/m. If this is out of the aforementioned range listed, please consider follow up with your Primary Care Provider.  If you are age 36 or younger, your body mass index should be between 19-25. Your Body mass index is 27.99 kg/m. If this is out of the aformentioned range listed, please consider follow up with your Primary Care Provider.   We have sent the following medications to your pharmacy for you to pick up at your convenience: Prevacid 30 mg twice daily until December and then once daily.   Follow up in 6 months.   Thank you,  Dr. Jackquline Denmark

## 2019-05-01 NOTE — Progress Notes (Signed)
Chief Complaint:   Referring Provider:  No ref. provider found      ASSESSMENT AND PLAN;   #1. Epigastric pain. H/o chole in past. On ibuprofen d/t dental pain (has dental appt in Dec 2020). Nl blood work. Neg EGD except for small [facility] 09/2016  #2. GERD with small HH  Plan: - Pravacid 30mg  po bid (60) until Dec 2020, thereafter once a day. - If still with problems in 2 weeks, would proceed with 11-14-1993 abdomen. - She is to call us in 2 weeks and let us know how she is doing. - Minimize ibuprofen.  If she has to use it she can use it with meals. - RTC 6 months.  Earlier, if still with problems.   HPI:    Jillian Franklin is a 68 y.o. female  Epi pain Aug 2020 -minimal, started after taking ibuprofen which she continues to take due to dental pain.  She is having extensive dental work-up and has next appointment in December.  She continues to take 1-2 ibuprofen 800 mg  every day on a as needed basis.  Here for medication refill.  Denies having any nausea, vomiting, heartburn, regurgitation, odynophagia or dysphagia.  No jaundice dark urine or pale stools.  No melena or hematochezia.  Past GI procedures: -EGD 10/05/2016-small hiatal hernia, mild gastritis. Neg gastric and SB Bx. -Colonoscopy 04/2012-mild sigmoid diverticulosis.  Otherwise normal to TI.  Repeat in 10 years Past Medical History:  Diagnosis Date  . Anxiety   . Arthritis   . Asthma   . Barrett's esophagus   . Depression   . GERD (gastroesophageal reflux disease)   . HTN (hypertension)   . Hypercholesteremia   . IBS (irritable bowel syndrome)   . Migraine   . Neuropathy   . Osteopenia   . Seasonal allergies   . Sleep apnea    CPAP Machine    Past Surgical History:  Procedure Laterality Date  . CHOLECYSTECTOMY    . COLONOSCOPY  04/30/2012   Mild sigmoid diverticulosis. Small internal hermorrhoids. Otherwise normal colonoscopy to TI.   05/02/2012 ESOPHAGOGASTRODUODENOSCOPY  10/05/2016   Small hiatal hernia. Mild  gastritis.   10/07/2016 SHOULDER SURGERY    . SPINAL FUSION    . TUBAL LIGATION      Family History  Problem Relation Age of Onset  . Breast cancer Mother   . Breast cancer Maternal Grandmother   . Breast cancer Maternal Aunt   . Colon cancer Neg Hx   . Esophageal cancer Neg Hx   . Liver cancer Neg Hx     Social History   Tobacco Use  . Smoking status: Never Smoker  . Smokeless tobacco: Never Used  Substance Use Topics  . Alcohol use: Not Currently  . Drug use: Not Currently    Current Outpatient Medications  Medication Sig Dispense Refill  . amitriptyline (ELAVIL) 25 MG tablet Take 50 mg by mouth at bedtime.    Marland Kitchen atenolol (TENORMIN) 25 MG tablet 12.5 mg 2 (two) times daily.    . budesonide-formoterol (SYMBICORT) 160-4.5 MCG/ACT inhaler 2 puffs 2 (two) times daily.    Marland Kitchen escitalopram (LEXAPRO) 10 MG tablet Take 10 mg by mouth daily.    . fluticasone (FLONASE) 50 MCG/ACT nasal spray Place into both nostrils as needed for allergies or rhinitis.    Marland Kitchen ipratropium-albuterol (DUONEB) 0.5-2.5 (3) MG/3ML SOLN Inhale into the lungs as needed.    Marland Kitchen oxyCODONE (OXYCONTIN) 10 mg 12 hr tablet Take by mouth as  needed.    . simvastatin (ZOCOR) 20 MG tablet Take 20 mg by mouth at bedtime.    . topiramate (TOPAMAX) 200 MG tablet 1 tablet 2 (two) times daily.     No current facility-administered medications for this visit.     Allergies  Allergen Reactions  . Celecoxib Other (See Comments)  . Citalopram Rash    Other reaction(s): Rash     Review of Systems:  neg     Physical Exam:    BP 102/70   Pulse 86   Temp (!) 97.5 F (36.4 C)   Ht 5\' 1"  (1.549 m)   Wt 148 lb 2 oz (67.2 kg)   BMI 27.99 kg/m  Filed Weights   05/01/19 0956  Weight: 148 lb 2 oz (67.2 kg)   Constitutional:  Well-developed, in no acute distress. Psychiatric: Normal mood and affect. Behavior is normal. HEENT: Pupils normal.  Conjunctivae are normal. No scleral icterus. Neck supple.  Cardiovascular: Normal  rate, regular rhythm. No edema Pulmonary/chest: Effort normal and breath sounds normal. No wheezing, rales or rhonchi. Abdominal: Soft, nondistended. Nontender. Bowel sounds active throughout. There are no masses palpable. No hepatomegaly. Rectal:  defered Neurological: Alert and oriented to person place and time. Skin: Skin is warm and dry. No rashes noted.    Carmell Austria, MD 05/01/2019, 10:13 AM  Cc: No ref. provider found

## 2019-08-15 DIAGNOSIS — Z7189 Other specified counseling: Secondary | ICD-10-CM | POA: Diagnosis not present

## 2019-08-15 DIAGNOSIS — E782 Mixed hyperlipidemia: Secondary | ICD-10-CM | POA: Diagnosis not present

## 2019-08-15 DIAGNOSIS — Z1331 Encounter for screening for depression: Secondary | ICD-10-CM | POA: Diagnosis not present

## 2019-08-15 DIAGNOSIS — Z1339 Encounter for screening examination for other mental health and behavioral disorders: Secondary | ICD-10-CM | POA: Diagnosis not present

## 2019-08-15 DIAGNOSIS — Z139 Encounter for screening, unspecified: Secondary | ICD-10-CM | POA: Diagnosis not present

## 2019-08-15 DIAGNOSIS — Z0000 Encounter for general adult medical examination without abnormal findings: Secondary | ICD-10-CM | POA: Diagnosis not present

## 2019-08-15 DIAGNOSIS — Z136 Encounter for screening for cardiovascular disorders: Secondary | ICD-10-CM | POA: Diagnosis not present

## 2019-08-15 DIAGNOSIS — R7303 Prediabetes: Secondary | ICD-10-CM | POA: Diagnosis not present

## 2019-08-22 DIAGNOSIS — E782 Mixed hyperlipidemia: Secondary | ICD-10-CM | POA: Diagnosis not present

## 2019-08-22 DIAGNOSIS — F339 Major depressive disorder, recurrent, unspecified: Secondary | ICD-10-CM | POA: Diagnosis not present

## 2019-08-22 DIAGNOSIS — E1165 Type 2 diabetes mellitus with hyperglycemia: Secondary | ICD-10-CM | POA: Diagnosis not present

## 2019-08-22 DIAGNOSIS — Z6828 Body mass index (BMI) 28.0-28.9, adult: Secondary | ICD-10-CM | POA: Diagnosis not present

## 2019-09-23 DIAGNOSIS — G43709 Chronic migraine without aura, not intractable, without status migrainosus: Secondary | ICD-10-CM | POA: Diagnosis not present

## 2019-09-23 DIAGNOSIS — G479 Sleep disorder, unspecified: Secondary | ICD-10-CM | POA: Diagnosis not present

## 2019-10-02 DIAGNOSIS — E1165 Type 2 diabetes mellitus with hyperglycemia: Secondary | ICD-10-CM | POA: Diagnosis not present

## 2019-10-16 DIAGNOSIS — E1165 Type 2 diabetes mellitus with hyperglycemia: Secondary | ICD-10-CM | POA: Diagnosis not present

## 2019-10-23 ENCOUNTER — Encounter: Payer: Self-pay | Admitting: Gastroenterology

## 2019-10-23 ENCOUNTER — Other Ambulatory Visit: Payer: Self-pay

## 2019-10-23 ENCOUNTER — Ambulatory Visit: Payer: PPO | Admitting: Gastroenterology

## 2019-10-23 VITALS — BP 100/60 | HR 74 | Temp 97.5°F | Ht 61.0 in | Wt 143.0 lb

## 2019-10-23 DIAGNOSIS — K219 Gastro-esophageal reflux disease without esophagitis: Secondary | ICD-10-CM | POA: Diagnosis not present

## 2019-10-23 NOTE — Progress Notes (Signed)
Chief Complaint:   Referring Provider:  Abner Greenspan, MD      ASSESSMENT AND PLAN;   #1. Epi pain (resolved after stopping ibuprofen). H/O lap chole in past. Nl blood work. Neg EGD except for small [facility] 09/2016  #2. GERD with small HH  #3. IBS-Diarrhea (after salads)  Plan: - Pravacid 30mg  po QD. - Blood work from Dr - Keep an eye out for diarrhea. Take imodim AD prn. If still with problems, trial of bentyl. - RTC 6 months.  Earlier, if still with problems.   HPI:    Jillian Franklin is a 69 y.o. female  For follow-up visit. She feels significantly better. Epigastric pain has resolved ever since she has stopped taking ibuprofen. She did have reflux and heartburn.  Has gotten better with Prevacid 30 mg p.o. twice daily.  Has been on twice daily dosing for the last 3 weeks.  Found to have elevated hemoglobin A1c at 6.9.  Being followed closely by Dr. 79.  Has been advised weight loss.  Otherwise she will be on diabetic medications.  Ms. Seckinger has been working hard and trying to reduce weight.  Denies having any nausea, vomiting, heartburn, regurgitation, odynophagia or dysphagia.  No jaundice dark urine or pale stools.  No melena or hematochezia.  Wt Readings from Last 3 Encounters:  10/23/19 143 lb (64.9 kg)  05/01/19 148 lb 2 oz (67.2 kg)     Past GI procedures: -EGD 10/05/2016-small hiatal hernia, mild gastritis. Neg gastric and SB Bx. -Colonoscopy 04/2012-mild sigmoid diverticulosis.  Otherwise normal to TI.  Repeat in 10 years Past Medical History:  Diagnosis Date  . Anxiety   . Arthritis   . Asthma   . Barrett's esophagus   . Depression   . GERD (gastroesophageal reflux disease)   . HTN (hypertension)   . Hypercholesteremia   . IBS (irritable bowel syndrome)   . Migraine   . Neuropathy   . Osteopenia   . Seasonal allergies   . Sleep apnea    CPAP Machine    Past Surgical History:  Procedure Laterality Date  . CHOLECYSTECTOMY      . COLONOSCOPY  04/30/2012   Mild sigmoid diverticulosis. Small internal hermorrhoids. Otherwise normal colonoscopy to TI.   05/02/2012 ESOPHAGOGASTRODUODENOSCOPY  10/05/2016   Small hiatal hernia. Mild gastritis.   10/07/2016 SHOULDER SURGERY    . SPINAL FUSION    . TUBAL LIGATION      Family History  Problem Relation Age of Onset  . Breast cancer Mother   . Breast cancer Maternal Grandmother   . Breast cancer Maternal Aunt   . Colon cancer Neg Hx   . Esophageal cancer Neg Hx   . Liver cancer Neg Hx     Social History   Tobacco Use  . Smoking status: Never Smoker  . Smokeless tobacco: Never Used  Substance Use Topics  . Alcohol use: Not Currently  . Drug use: Not Currently    Current Outpatient Medications  Medication Sig Dispense Refill  . amitriptyline (ELAVIL) 25 MG tablet Take 50 mg by mouth at bedtime.    Marland Kitchen escitalopram (LEXAPRO) 10 MG tablet Take 10 mg by mouth daily.    . fluticasone (FLONASE) 50 MCG/ACT nasal spray Place into both nostrils as needed for allergies or rhinitis.    Marland Kitchen lansoprazole (PREVACID) 30 MG capsule Take 1 capsule (30 mg total) by mouth 2 (two) times daily. 60 capsule 6  . oxyCODONE (OXYCONTIN) 10 mg  12 hr tablet Take by mouth as needed.    . simvastatin (ZOCOR) 20 MG tablet Take 20 mg by mouth at bedtime.    . topiramate (TOPAMAX) 200 MG tablet 1 tablet 2 (two) times daily.     No current facility-administered medications for this visit.    Allergies  Allergen Reactions  . Celecoxib Other (See Comments)  . Citalopram Rash    Other reaction(s): Rash     Review of Systems:  neg     Physical Exam:    BP 100/60   Pulse 74   Temp (!) 97.5 F (36.4 C)   Ht 5\' 1"  (1.549 m)   Wt 143 lb (64.9 kg)   BMI 27.02 kg/m  Filed Weights   10/23/19 1106  Weight: 143 lb (64.9 kg)   Constitutional:  Well-developed, in no acute distress. Psychiatric: Normal mood and affect. Behavior is normal. HEENT: Pupils normal.  Conjunctivae are normal. No scleral  icterus. Neck supple.  Cardiovascular: Normal rate, regular rhythm. No edema Pulmonary/chest: Effort normal and breath sounds normal. No wheezing, rales or rhonchi. Abdominal: Soft, nondistended. Nontender. Bowel sounds active throughout. There are no masses palpable. No hepatomegaly. Rectal:  defered Neurological: Alert and oriented to person place and time. Skin: Skin is warm and dry. No rashes noted.    Carmell Austria, MD 10/23/2019, 11:21 AM  Cc: Marco Collie, MD

## 2019-10-23 NOTE — Patient Instructions (Signed)
If you are age 68 or older, your body mass index should be between 23-30. Your Body mass index is 27.02 kg/m. If this is out of the aforementioned range listed, please consider follow up with your Primary Care Provider.  If you are age 17 or younger, your body mass index should be between 19-25. Your Body mass index is 27.02 kg/m. If this is out of the aformentioned range listed, please consider follow up with your Primary Care Provider.   Follow up 6 months.   Thank you,  Dr. Lynann Bologna

## 2019-10-30 DIAGNOSIS — I099 Rheumatic heart disease, unspecified: Secondary | ICD-10-CM | POA: Diagnosis not present

## 2019-10-30 DIAGNOSIS — E1165 Type 2 diabetes mellitus with hyperglycemia: Secondary | ICD-10-CM | POA: Diagnosis not present

## 2019-10-30 DIAGNOSIS — Z9989 Dependence on other enabling machines and devices: Secondary | ICD-10-CM | POA: Diagnosis not present

## 2019-10-30 DIAGNOSIS — G4733 Obstructive sleep apnea (adult) (pediatric): Secondary | ICD-10-CM | POA: Diagnosis not present

## 2019-11-13 DIAGNOSIS — E1165 Type 2 diabetes mellitus with hyperglycemia: Secondary | ICD-10-CM | POA: Diagnosis not present

## 2019-11-14 DIAGNOSIS — E782 Mixed hyperlipidemia: Secondary | ICD-10-CM | POA: Diagnosis not present

## 2019-11-14 DIAGNOSIS — E1165 Type 2 diabetes mellitus with hyperglycemia: Secondary | ICD-10-CM | POA: Diagnosis not present

## 2019-11-21 DIAGNOSIS — E782 Mixed hyperlipidemia: Secondary | ICD-10-CM | POA: Diagnosis not present

## 2019-11-21 DIAGNOSIS — Z6827 Body mass index (BMI) 27.0-27.9, adult: Secondary | ICD-10-CM | POA: Diagnosis not present

## 2019-11-21 DIAGNOSIS — E1165 Type 2 diabetes mellitus with hyperglycemia: Secondary | ICD-10-CM | POA: Diagnosis not present

## 2019-12-06 DIAGNOSIS — L259 Unspecified contact dermatitis, unspecified cause: Secondary | ICD-10-CM | POA: Diagnosis not present

## 2019-12-15 ENCOUNTER — Other Ambulatory Visit: Payer: Self-pay | Admitting: Gastroenterology

## 2019-12-22 DIAGNOSIS — R0981 Nasal congestion: Secondary | ICD-10-CM | POA: Diagnosis not present

## 2019-12-22 DIAGNOSIS — J019 Acute sinusitis, unspecified: Secondary | ICD-10-CM | POA: Diagnosis not present

## 2019-12-22 DIAGNOSIS — Z20822 Contact with and (suspected) exposure to covid-19: Secondary | ICD-10-CM | POA: Diagnosis not present

## 2020-02-24 ENCOUNTER — Other Ambulatory Visit: Payer: Self-pay | Admitting: Gastroenterology

## 2020-03-12 DIAGNOSIS — E1165 Type 2 diabetes mellitus with hyperglycemia: Secondary | ICD-10-CM | POA: Diagnosis not present

## 2020-03-19 DIAGNOSIS — F339 Major depressive disorder, recurrent, unspecified: Secondary | ICD-10-CM | POA: Diagnosis not present

## 2020-03-19 DIAGNOSIS — Z23 Encounter for immunization: Secondary | ICD-10-CM | POA: Diagnosis not present

## 2020-03-19 DIAGNOSIS — E1165 Type 2 diabetes mellitus with hyperglycemia: Secondary | ICD-10-CM | POA: Diagnosis not present

## 2020-03-19 DIAGNOSIS — E782 Mixed hyperlipidemia: Secondary | ICD-10-CM | POA: Diagnosis not present

## 2020-03-25 DIAGNOSIS — G479 Sleep disorder, unspecified: Secondary | ICD-10-CM | POA: Diagnosis not present

## 2020-03-25 DIAGNOSIS — G43009 Migraine without aura, not intractable, without status migrainosus: Secondary | ICD-10-CM | POA: Diagnosis not present

## 2020-06-03 ENCOUNTER — Ambulatory Visit: Payer: PPO | Admitting: Gastroenterology

## 2020-06-03 ENCOUNTER — Encounter: Payer: Self-pay | Admitting: Gastroenterology

## 2020-06-03 VITALS — BP 110/64 | HR 76 | Ht 61.0 in | Wt 148.5 lb

## 2020-06-03 DIAGNOSIS — K219 Gastro-esophageal reflux disease without esophagitis: Secondary | ICD-10-CM | POA: Diagnosis not present

## 2020-06-03 DIAGNOSIS — R1013 Epigastric pain: Secondary | ICD-10-CM

## 2020-06-03 DIAGNOSIS — K449 Diaphragmatic hernia without obstruction or gangrene: Secondary | ICD-10-CM | POA: Diagnosis not present

## 2020-06-03 MED ORDER — LANSOPRAZOLE 30 MG PO CPDR: 30.0000 mg | DELAYED_RELEASE_CAPSULE | Freq: Every day | ORAL | 3 refills | Status: DC

## 2020-06-03 NOTE — Patient Instructions (Signed)
If you are age 68 or older, your body mass index should be between 23-30. Your Body mass index is 28.06 kg/m. If this is out of the aforementioned range listed, please consider follow up with your Primary Care Provider.  If you are age 8 or younger, your body mass index should be between 19-25. Your Body mass index is 28.06 kg/m. If this is out of the aformentioned range listed, please consider follow up with your Primary Care Provider.   We have sent the following medications to your pharmacy for you to pick up at your convenience: Prevacid  You will be due for a recall colonoscopy in 04/2022. We will send you a reminder in the mail when it gets closer to that time.  Thank you,  Dr. Lynann Bologna

## 2020-06-03 NOTE — Progress Notes (Signed)
Chief Complaint: FU  Referring Provider:  Abner Greenspan, MD      ASSESSMENT AND PLAN;   #1. Epi pain (resolved after stopping ibuprofen). H/O lap chole in past. Nl blood work. Neg EGD except for small [facility] 09/2016  #2. GERD with small HH  #3. IBS-Diarrhea (after salads)  Plan: - Pravacid 30mg  po QD #90, 4 refills - Can use TUMS on as needed basis. - Screening colon 04/2022. - FU 1 year.  Earlier, if with any problems.   HPI:    Jillian Franklin is a 69 y.o. female  For follow-up visit. Here for medication refill. She feels significantly better. Does have reflux symptoms if she misses even a single dose of Prevacid.  She takes Prevacid at night.  She will occasionally have heartburn in the afternoon.  No odynophagia or dysphagia.  No further epigastric pain since she has stopped taking ibuprofen.  Denies having any nausea, vomiting, heartburn, regurgitation, odynophagia or dysphagia.  No jaundice dark urine or pale stools.  No melena or hematochezia.  Wt Readings from Last 3 Encounters:  06/03/20 148 lb 8 oz (67.4 kg)  10/23/19 143 lb (64.9 kg)  05/01/19 148 lb 2 oz (67.2 kg)     Past GI procedures: -EGD 10/05/2016-small hiatal hernia, mild gastritis. Neg gastric and SB Bx. NO BARRETTs -Colonoscopy 04/2012-mild sigmoid diverticulosis.  Otherwise normal to TI.  Repeat in 10 years Past Medical History:  Diagnosis Date  . Anxiety   . Arthritis   . Asthma   . Barrett's esophagus   . Depression   . GERD (gastroesophageal reflux disease)   . HTN (hypertension)   . Hypercholesteremia   . IBS (irritable bowel syndrome)   . Migraine   . Neuropathy   . Osteopenia   . Seasonal allergies   . Sleep apnea    CPAP Machine    Past Surgical History:  Procedure Laterality Date  . CHOLECYSTECTOMY    . COLONOSCOPY  04/30/2012   Mild sigmoid diverticulosis. Small internal hermorrhoids. Otherwise normal colonoscopy to TI.   05/02/2012 ESOPHAGOGASTRODUODENOSCOPY  10/05/2016   Small  hiatal hernia. Mild gastritis.   10/07/2016 SHOULDER SURGERY    . SPINAL FUSION    . TUBAL LIGATION      Family History  Problem Relation Age of Onset  . Breast cancer Mother   . Breast cancer Maternal Grandmother   . Breast cancer Maternal Aunt   . Colon cancer Neg Hx   . Esophageal cancer Neg Hx   . Liver cancer Neg Hx     Social History   Tobacco Use  . Smoking status: Never Smoker  . Smokeless tobacco: Never Used  Vaping Use  . Vaping Use: Never used  Substance Use Topics  . Alcohol use: Not Currently  . Drug use: Not Currently    Current Outpatient Medications  Medication Sig Dispense Refill  . amitriptyline (ELAVIL) 25 MG tablet Take 50 mg by mouth at bedtime.    . baclofen (LIORESAL) 10 MG tablet Take 0.5-1 tablets by mouth as needed.    Marland Kitchen escitalopram (LEXAPRO) 10 MG tablet Take 10 mg by mouth daily.    . fluticasone (FLONASE) 50 MCG/ACT nasal spray Place into both nostrils as needed for allergies or rhinitis.    Marland Kitchen lansoprazole (PREVACID) 30 MG capsule Take 1 capsule (30 mg total) by mouth daily. 90 capsule 1  . oxyCODONE (OXYCONTIN) 10 mg 12 hr tablet Take by mouth as needed.    . simvastatin (ZOCOR)  20 MG tablet Take 20 mg by mouth at bedtime.    . topiramate (TOPAMAX) 200 MG tablet 1 tablet 2 (two) times daily.     No current facility-administered medications for this visit.    Allergies  Allergen Reactions  . Celecoxib Other (See Comments)  . Citalopram Rash    Other reaction(s): Rash     Review of Systems:  neg     Physical Exam:    BP 110/64   Pulse 76   Ht 5\' 1"  (1.549 m)   Wt 148 lb 8 oz (67.4 kg)   BMI 28.06 kg/m  Filed Weights   06/03/20 0928  Weight: 148 lb 8 oz (67.4 kg)   Constitutional:  Well-developed, in no acute distress. Psychiatric: Normal mood and affect. Behavior is normal. HEENT: Pupils normal.  Conjunctivae are normal. No scleral icterus. Neck supple.  Cardiovascular: Normal rate, regular rhythm. No edema Pulmonary/chest:  Effort normal and breath sounds normal. No wheezing, rales or rhonchi. Abdominal: Soft, nondistended. Nontender. Bowel sounds active throughout. There are no masses palpable. No hepatomegaly. Rectal:  defered Neurological: Alert and oriented to person place and time. Skin: Skin is warm and dry. No rashes noted.    06/05/20, MD 06/03/2020, 9:36 AM  Cc: 06/05/2020, MD

## 2020-07-14 DIAGNOSIS — E1165 Type 2 diabetes mellitus with hyperglycemia: Secondary | ICD-10-CM | POA: Diagnosis not present

## 2020-07-23 DIAGNOSIS — E782 Mixed hyperlipidemia: Secondary | ICD-10-CM | POA: Diagnosis not present

## 2020-07-23 DIAGNOSIS — F3130 Bipolar disorder, current episode depressed, mild or moderate severity, unspecified: Secondary | ICD-10-CM | POA: Diagnosis not present

## 2020-07-23 DIAGNOSIS — G4733 Obstructive sleep apnea (adult) (pediatric): Secondary | ICD-10-CM | POA: Diagnosis not present

## 2020-07-23 DIAGNOSIS — E1165 Type 2 diabetes mellitus with hyperglycemia: Secondary | ICD-10-CM | POA: Diagnosis not present

## 2020-08-09 DIAGNOSIS — M79645 Pain in left finger(s): Secondary | ICD-10-CM | POA: Diagnosis not present

## 2020-08-21 DIAGNOSIS — M20012 Mallet finger of left finger(s): Secondary | ICD-10-CM | POA: Diagnosis not present

## 2020-09-03 DIAGNOSIS — Z1339 Encounter for screening examination for other mental health and behavioral disorders: Secondary | ICD-10-CM | POA: Diagnosis not present

## 2020-09-03 DIAGNOSIS — Z136 Encounter for screening for cardiovascular disorders: Secondary | ICD-10-CM | POA: Diagnosis not present

## 2020-09-03 DIAGNOSIS — Z1331 Encounter for screening for depression: Secondary | ICD-10-CM | POA: Diagnosis not present

## 2020-09-03 DIAGNOSIS — Z0000 Encounter for general adult medical examination without abnormal findings: Secondary | ICD-10-CM | POA: Diagnosis not present

## 2020-09-03 DIAGNOSIS — Z139 Encounter for screening, unspecified: Secondary | ICD-10-CM | POA: Diagnosis not present

## 2020-09-22 DIAGNOSIS — R208 Other disturbances of skin sensation: Secondary | ICD-10-CM | POA: Diagnosis not present

## 2020-09-22 DIAGNOSIS — G479 Sleep disorder, unspecified: Secondary | ICD-10-CM | POA: Diagnosis not present

## 2020-09-22 DIAGNOSIS — G43009 Migraine without aura, not intractable, without status migrainosus: Secondary | ICD-10-CM | POA: Diagnosis not present

## 2020-09-22 DIAGNOSIS — G2581 Restless legs syndrome: Secondary | ICD-10-CM | POA: Diagnosis not present

## 2020-10-16 DIAGNOSIS — M20012 Mallet finger of left finger(s): Secondary | ICD-10-CM | POA: Diagnosis not present

## 2020-10-19 DIAGNOSIS — E2839 Other primary ovarian failure: Secondary | ICD-10-CM | POA: Diagnosis not present

## 2020-10-19 DIAGNOSIS — Z780 Asymptomatic menopausal state: Secondary | ICD-10-CM | POA: Diagnosis not present

## 2020-10-19 DIAGNOSIS — Z1231 Encounter for screening mammogram for malignant neoplasm of breast: Secondary | ICD-10-CM | POA: Diagnosis not present

## 2020-11-03 ENCOUNTER — Other Ambulatory Visit: Payer: Self-pay | Admitting: Gastroenterology

## 2020-11-12 DIAGNOSIS — E1165 Type 2 diabetes mellitus with hyperglycemia: Secondary | ICD-10-CM | POA: Diagnosis not present

## 2020-11-19 DIAGNOSIS — E1165 Type 2 diabetes mellitus with hyperglycemia: Secondary | ICD-10-CM | POA: Diagnosis not present

## 2020-11-19 DIAGNOSIS — M8580 Other specified disorders of bone density and structure, unspecified site: Secondary | ICD-10-CM | POA: Diagnosis not present

## 2020-11-19 DIAGNOSIS — E782 Mixed hyperlipidemia: Secondary | ICD-10-CM | POA: Diagnosis not present

## 2020-11-19 DIAGNOSIS — F339 Major depressive disorder, recurrent, unspecified: Secondary | ICD-10-CM | POA: Diagnosis not present

## 2020-12-15 DIAGNOSIS — H25813 Combined forms of age-related cataract, bilateral: Secondary | ICD-10-CM | POA: Diagnosis not present

## 2020-12-15 DIAGNOSIS — H353131 Nonexudative age-related macular degeneration, bilateral, early dry stage: Secondary | ICD-10-CM | POA: Diagnosis not present

## 2021-01-05 DIAGNOSIS — F32A Depression, unspecified: Secondary | ICD-10-CM | POA: Diagnosis not present

## 2021-01-05 DIAGNOSIS — H259 Unspecified age-related cataract: Secondary | ICD-10-CM | POA: Diagnosis not present

## 2021-01-05 DIAGNOSIS — J45909 Unspecified asthma, uncomplicated: Secondary | ICD-10-CM | POA: Diagnosis not present

## 2021-01-05 DIAGNOSIS — G4730 Sleep apnea, unspecified: Secondary | ICD-10-CM | POA: Diagnosis not present

## 2021-01-05 DIAGNOSIS — H25811 Combined forms of age-related cataract, right eye: Secondary | ICD-10-CM | POA: Diagnosis not present

## 2021-01-05 DIAGNOSIS — K219 Gastro-esophageal reflux disease without esophagitis: Secondary | ICD-10-CM | POA: Diagnosis not present

## 2021-01-05 DIAGNOSIS — F419 Anxiety disorder, unspecified: Secondary | ICD-10-CM | POA: Diagnosis not present

## 2021-01-12 DIAGNOSIS — H25812 Combined forms of age-related cataract, left eye: Secondary | ICD-10-CM | POA: Diagnosis not present

## 2021-01-12 DIAGNOSIS — Z01818 Encounter for other preprocedural examination: Secondary | ICD-10-CM | POA: Diagnosis not present

## 2021-02-02 DIAGNOSIS — H259 Unspecified age-related cataract: Secondary | ICD-10-CM | POA: Diagnosis not present

## 2021-02-02 DIAGNOSIS — J45909 Unspecified asthma, uncomplicated: Secondary | ICD-10-CM | POA: Diagnosis not present

## 2021-02-02 DIAGNOSIS — G4733 Obstructive sleep apnea (adult) (pediatric): Secondary | ICD-10-CM | POA: Diagnosis not present

## 2021-02-02 DIAGNOSIS — H25812 Combined forms of age-related cataract, left eye: Secondary | ICD-10-CM | POA: Diagnosis not present

## 2021-03-25 DIAGNOSIS — G2581 Restless legs syndrome: Secondary | ICD-10-CM | POA: Diagnosis not present

## 2021-03-25 DIAGNOSIS — E1165 Type 2 diabetes mellitus with hyperglycemia: Secondary | ICD-10-CM | POA: Diagnosis not present

## 2021-03-25 DIAGNOSIS — G479 Sleep disorder, unspecified: Secondary | ICD-10-CM | POA: Diagnosis not present

## 2021-03-25 DIAGNOSIS — G43009 Migraine without aura, not intractable, without status migrainosus: Secondary | ICD-10-CM | POA: Diagnosis not present

## 2021-04-16 DIAGNOSIS — E1165 Type 2 diabetes mellitus with hyperglycemia: Secondary | ICD-10-CM | POA: Diagnosis not present

## 2021-04-16 DIAGNOSIS — Z23 Encounter for immunization: Secondary | ICD-10-CM | POA: Diagnosis not present

## 2021-04-16 DIAGNOSIS — M545 Low back pain, unspecified: Secondary | ICD-10-CM | POA: Diagnosis not present

## 2021-04-16 DIAGNOSIS — F3130 Bipolar disorder, current episode depressed, mild or moderate severity, unspecified: Secondary | ICD-10-CM | POA: Diagnosis not present

## 2021-04-16 DIAGNOSIS — G8929 Other chronic pain: Secondary | ICD-10-CM | POA: Diagnosis not present

## 2021-04-21 ENCOUNTER — Ambulatory Visit (INDEPENDENT_AMBULATORY_CARE_PROVIDER_SITE_OTHER): Payer: PPO | Admitting: Gastroenterology

## 2021-04-21 ENCOUNTER — Encounter: Payer: Self-pay | Admitting: Gastroenterology

## 2021-04-21 ENCOUNTER — Other Ambulatory Visit (INDEPENDENT_AMBULATORY_CARE_PROVIDER_SITE_OTHER): Payer: PPO

## 2021-04-21 ENCOUNTER — Other Ambulatory Visit: Payer: Self-pay

## 2021-04-21 VITALS — BP 128/76 | HR 82 | Ht 61.0 in | Wt 146.0 lb

## 2021-04-21 DIAGNOSIS — R1012 Left upper quadrant pain: Secondary | ICD-10-CM

## 2021-04-21 DIAGNOSIS — K580 Irritable bowel syndrome with diarrhea: Secondary | ICD-10-CM

## 2021-04-21 DIAGNOSIS — R1032 Left lower quadrant pain: Secondary | ICD-10-CM

## 2021-04-21 DIAGNOSIS — K219 Gastro-esophageal reflux disease without esophagitis: Secondary | ICD-10-CM | POA: Diagnosis not present

## 2021-04-21 DIAGNOSIS — K449 Diaphragmatic hernia without obstruction or gangrene: Secondary | ICD-10-CM

## 2021-04-21 MED ORDER — ONDANSETRON 4 MG PO TBDP
4.0000 mg | ORAL_TABLET | Freq: Four times a day (QID) | ORAL | 1 refills | Status: DC
Start: 2021-04-21 — End: 2024-02-26

## 2021-04-21 MED ORDER — LANSOPRAZOLE 30 MG PO CPDR: 30.0000 mg | DELAYED_RELEASE_CAPSULE | Freq: Two times a day (BID) | ORAL | 2 refills | Status: DC

## 2021-04-21 NOTE — Patient Instructions (Addendum)
If you are age 69 or older, your body mass index should be between 23-30. Your Body mass index is 27.59 kg/m. If this is out of the aforementioned range listed, please consider follow up with your Primary Care Provider.  If you are age 64 or younger, your body mass index should be between 19-25. Your Body mass index is 27.59 kg/m. If this is out of the aformentioned range listed, please consider follow up with your Primary Care Provider.   __________________________________________________________  The [facility] providers would like to encourage you to use MYCHART to communicate with providers for non-urgent requests or questions.  Due to long hold times on the telephone, sending your provider a message by MYCHART may be a faster and more efficient way to get a response.  Please allow 48 business hours for a response.  Please remember that this is for non-urgent requests.   Please go to the lab on the 2nd floor [address] before you leave the office today.   Repeat colonoscopy for 04-2022. We will sent a letter as it gets closer but you can call 2 months in advance to see about scheduling this.  We have sent the following medications to your pharmacy for you to pick up at your convenience: Prevacid Zofran  Can use tums on as needed basis  Follow up in 1 year by calling the office to make an appointment.  You have been scheduled for a CT scan of the abdomen and pelvis at [facility] [place] ([address] [place], [place] [address] [address]).   You are scheduled on          at            . You should arrive 15 minutes prior to your appointment time for registration. Please follow the written instructions below on the day of your exam:  WARNING: IF YOU ARE ALLERGIC TO IODINE/X-RAY DYE, PLEASE NOTIFY RADIOLOGY IMMEDIATELY AT [phone]! YOU WILL BE GIVEN A 13 HOUR PREMEDICATION PREP.  1) Do not eat or drink anything after       (4 hours prior to your test) 2) You have been  given 2 bottles of oral contrast to drink. The solution may taste better if refrigerated, but do NOT add ice or any other liquid to this solution. Shake well before drinking.    Drink 1 bottle of contrast @         (2 hours prior to your exam)  Drink 1 bottle of contrast @         (1 hour prior to your exam)  You may take any medications as prescribed with a small amount of water, if necessary. If you take any of the following medications: METFORMIN, GLUCOPHAGE, GLUCOVANCE, AVANDAMET, RIOMET, FORTAMET, ACTOPLUS MET, JANUMET, GLUMETZA or METAGLIP, you MAY be asked to HOLD this medication 48 hours AFTER the exam.  The purpose of you drinking the oral contrast is to aid in the visualization of your intestinal tract. The contrast solution may cause some diarrhea. Depending on your individual set of symptoms, you may also receive an intravenous injection of x-ray contrast/dye. Plan on being at [facility] for 30 minutes or longer, depending on the type of exam you are having performed.  This test typically takes 30-45 minutes to complete.  If you have any questions regarding your exam or if you need to reschedule, you may call the CT department at [phone] between the hours of 8:00 am and 5:00   pm, Monday-Friday.  ________________________________________________________________________  Thank you,  Dr. Rajesh Gupta   

## 2021-04-21 NOTE — Progress Notes (Signed)
Chief Complaint: FU  Referring Provider:  Abner Greenspan, MD      ASSESSMENT AND PLAN;   #1. LUQ pain/LLQ pain with tenderness  #2. GERD with small HH with nausea.  #3. IBS-Diarrhea (after salads). H/O lap chole in past.  Plan: - Continue with Pravacid 30mg  po BID - Trial of Zofan 4mg  ODT Q6hrs prn #25 - CBC, CMP, lipase - CT AP with PO and IV contrast - Can use TUMS on as needed basis. - If still with upper GI symptoms, solid-phase GES. - If there is any increasing diarrhea, would give her a trial of cholestyramine. - Screening colon 04/2022. - FU after the above is complete   HPI:    Jillian Franklin is a 70 y.o. female  For follow-up visit.  Recurrent Nausea lasting for few min.  Not related to oxycodone.  Had increased heartburn despite Prevacid once a day.  Her heartburn is better with twice daily Prevacid.  She could not tell if she misses even a single dose.  C/O LUQ pain, LLQ pain lasting 30-45 min, crampy, with chills but no fever.  No melena or hematochezia.  He does have occasional diarrhea especially after eating salads.  Oxy does hurt the stomach and she uses it very sparingly.  Last time was 3 months ago.  No weight loss.  No sodas, chocolates, chewing gums, artificial sweeteners and candy. No NSAIDs    Wt Readings from Last 3 Encounters:  04/21/21 146 lb (66.2 kg)  06/03/20 148 lb 8 oz (67.4 kg)  10/23/19 143 lb (64.9 kg)     Past GI procedures: -EGD 10/05/2016-small hiatal hernia, mild gastritis. Neg gastric and SB Bx. NO BARRETTs -Colonoscopy 04/2012-mild sigmoid diverticulosis.  Otherwise normal to TI.  Repeat in 10 years.  Earlier, if with any new problems Past Medical History:  Diagnosis Date   Anxiety    Arthritis    Asthma    Barrett's esophagus    Depression    Diabetes (HCC)    GERD (gastroesophageal reflux disease)    HTN (hypertension)    Hypercholesteremia    IBS (irritable bowel syndrome)    Migraine    Neuropathy     Osteopenia    Seasonal allergies    Sleep apnea    CPAP Machine    Past Surgical History:  Procedure Laterality Date   CATARACT EXTRACTION Bilateral    may and june 2022   CHOLECYSTECTOMY     COLONOSCOPY  04/30/2012   Mild sigmoid diverticulosis. Small internal hermorrhoids. Otherwise normal colonoscopy to TI.    ESOPHAGOGASTRODUODENOSCOPY  10/05/2016   Small hiatal hernia. Mild gastritis.    SHOULDER SURGERY     SPINAL FUSION     TUBAL LIGATION      Family History  Problem Relation Age of Onset   Breast cancer Mother    Breast cancer Maternal Grandmother    Breast cancer Maternal Aunt    Colon cancer Neg Hx    Esophageal cancer Neg Hx    Liver cancer Neg Hx     Social History   Tobacco Use   Smoking status: Never   Smokeless tobacco: Never  Vaping Use   Vaping Use: Never used  Substance Use Topics   Alcohol use: Not Currently   Drug use: Not Currently    Current Outpatient Medications  Medication Sig Dispense Refill   amitriptyline (ELAVIL) 25 MG tablet Take 50 mg by mouth at bedtime.     baclofen (LIORESAL) 10 MG  tablet Take 0.5-1 tablets by mouth as needed.     escitalopram (LEXAPRO) 10 MG tablet Take 10 mg by mouth daily.     fluticasone (FLONASE) 50 MCG/ACT nasal spray Place into both nostrils as needed for allergies or rhinitis.     lansoprazole (PREVACID) 30 MG capsule Take 1 capsule by mouth twice daily 180 capsule 2   oxyCODONE (OXYCONTIN) 10 mg 12 hr tablet Take 5 mg by mouth as needed. Takes 5 mg prn     simvastatin (ZOCOR) 20 MG tablet Take 20 mg by mouth at bedtime.     topiramate (TOPAMAX) 200 MG tablet 1 tablet 2 (two) times daily.     No current facility-administered medications for this visit.    Allergies  Allergen Reactions   Celecoxib Other (See Comments)   Citalopram Rash    Other reaction(s): Rash     Review of Systems:  neg     Physical Exam:    BP 128/76   Pulse 82   Ht 5\' 1"  (1.549 m)   Wt 146 lb (66.2 kg)   SpO2 95%    BMI 27.59 kg/m  Filed Weights   04/21/21 1411  Weight: 146 lb (66.2 kg)   Constitutional:  Well-developed, in no acute distress. Psychiatric: Normal mood and affect. Behavior is normal. HEENT: Pupils normal.  Conjunctivae are normal. No scleral icterus. Neck supple.  Cardiovascular: Normal rate, regular rhythm. No edema Pulmonary/chest: Effort normal and breath sounds normal. No wheezing, rales or rhonchi. Abdominal: Soft, nondistended. LLQ tenderness.  No rebound.  Bowel sounds active throughout. There are no masses palpable. No hepatomegaly. Rectal:  defered Neurological: Alert and oriented to person place and time. Skin: Skin is warm and dry. No rashes noted.    06/21/21, MD 04/21/2021, 2:14 PM  Cc: 06/21/2021, MD

## 2021-04-22 LAB — CBC WITH DIFFERENTIAL/PLATELET
Basophils Absolute: 0 10*3/uL (ref 0.0–0.1)
Basophils Relative: 0.7 % (ref 0.0–3.0)
Eosinophils Absolute: 0.4 10*3/uL (ref 0.0–0.7)
Eosinophils Relative: 7.1 % — ABNORMAL HIGH (ref 0.0–5.0)
HCT: 40.3 % (ref 36.0–46.0)
Hemoglobin: 13.2 g/dL (ref 12.0–15.0)
Lymphocytes Relative: 32.6 % (ref 12.0–46.0)
Lymphs Abs: 1.9 10*3/uL (ref 0.7–4.0)
MCHC: 32.8 g/dL (ref 30.0–36.0)
MCV: 90.5 fl (ref 78.0–100.0)
Monocytes Absolute: 0.5 10*3/uL (ref 0.1–1.0)
Monocytes Relative: 7.9 % (ref 3.0–12.0)
Neutro Abs: 2.9 10*3/uL (ref 1.4–7.7)
Neutrophils Relative %: 51.7 % (ref 43.0–77.0)
Platelets: 257 10*3/uL (ref 150.0–400.0)
RBC: 4.45 Mil/uL (ref 3.87–5.11)
RDW: 13.6 % (ref 11.5–15.5)
WBC: 5.7 10*3/uL (ref 4.0–10.5)

## 2021-04-22 LAB — COMPREHENSIVE METABOLIC PANEL
ALT: 11 U/L (ref 0–35)
AST: 20 U/L (ref 0–37)
Albumin: 4.2 g/dL (ref 3.5–5.2)
Alkaline Phosphatase: 55 U/L (ref 39–117)
BUN: 15 mg/dL (ref 6–23)
CO2: 26 mEq/L (ref 19–32)
Calcium: 9.1 mg/dL (ref 8.4–10.5)
Chloride: 106 mEq/L (ref 96–112)
Creatinine, Ser: 0.92 mg/dL (ref 0.40–1.20)
GFR: 63.73 mL/min (ref >60.00)
Glucose, Bld: 106 mg/dL — ABNORMAL HIGH (ref 70–99)
Potassium: 3.7 mEq/L (ref 3.5–5.1)
Sodium: 140 mEq/L (ref 135–145)
Total Bilirubin: 0.2 mg/dL (ref 0.2–1.2)
Total Protein: 6.8 g/dL (ref 6.0–8.3)

## 2021-04-22 LAB — LIPASE: Lipase: 33 U/L (ref 11.0–59.0)

## 2021-04-29 ENCOUNTER — Ambulatory Visit (HOSPITAL_BASED_OUTPATIENT_CLINIC_OR_DEPARTMENT_OTHER)
Admission: RE | Admit: 2021-04-29 | Discharge: 2021-04-29 | Disposition: A | Discharge status: Home / Self Care | Payer: PPO | Source: Ambulatory Visit | Attending: Gastroenterology | Admitting: Gastroenterology

## 2021-04-29 ENCOUNTER — Encounter (HOSPITAL_BASED_OUTPATIENT_CLINIC_OR_DEPARTMENT_OTHER): Payer: Self-pay

## 2021-04-29 ENCOUNTER — Other Ambulatory Visit: Payer: Self-pay

## 2021-04-29 DIAGNOSIS — Z8719 Personal history of other diseases of the digestive system: Secondary | ICD-10-CM | POA: Diagnosis not present

## 2021-04-29 DIAGNOSIS — K449 Diaphragmatic hernia without obstruction or gangrene: Secondary | ICD-10-CM | POA: Insufficient documentation

## 2021-04-29 DIAGNOSIS — R1012 Left upper quadrant pain: Secondary | ICD-10-CM | POA: Diagnosis not present

## 2021-04-29 DIAGNOSIS — K580 Irritable bowel syndrome with diarrhea: Secondary | ICD-10-CM | POA: Diagnosis not present

## 2021-04-29 DIAGNOSIS — R1013 Epigastric pain: Secondary | ICD-10-CM | POA: Diagnosis not present

## 2021-04-29 DIAGNOSIS — K219 Gastro-esophageal reflux disease without esophagitis: Secondary | ICD-10-CM | POA: Diagnosis not present

## 2021-04-29 DIAGNOSIS — R1032 Left lower quadrant pain: Secondary | ICD-10-CM | POA: Insufficient documentation

## 2021-04-29 DIAGNOSIS — R110 Nausea: Secondary | ICD-10-CM | POA: Diagnosis not present

## 2021-04-29 DIAGNOSIS — R197 Diarrhea, unspecified: Secondary | ICD-10-CM | POA: Diagnosis not present

## 2021-04-29 MED ORDER — IOHEXOL 300 MG/ML  SOLN
100.0000 mL | Freq: Once | INTRAMUSCULAR | Status: AC | PRN
  Administered 2021-04-29: 100 mL via INTRAVENOUS

## 2021-06-11 DIAGNOSIS — Z6827 Body mass index (BMI) 27.0-27.9, adult: Secondary | ICD-10-CM | POA: Diagnosis not present

## 2021-06-11 DIAGNOSIS — H8113 Benign paroxysmal vertigo, bilateral: Secondary | ICD-10-CM | POA: Diagnosis not present

## 2021-07-28 DIAGNOSIS — G479 Sleep disorder, unspecified: Secondary | ICD-10-CM | POA: Diagnosis not present

## 2021-07-28 DIAGNOSIS — G43009 Migraine without aura, not intractable, without status migrainosus: Secondary | ICD-10-CM | POA: Diagnosis not present

## 2021-07-28 DIAGNOSIS — G2581 Restless legs syndrome: Secondary | ICD-10-CM | POA: Diagnosis not present

## 2021-08-19 DIAGNOSIS — E1165 Type 2 diabetes mellitus with hyperglycemia: Secondary | ICD-10-CM | POA: Diagnosis not present

## 2021-08-26 DIAGNOSIS — F339 Major depressive disorder, recurrent, unspecified: Secondary | ICD-10-CM | POA: Diagnosis not present

## 2021-08-26 DIAGNOSIS — Z23 Encounter for immunization: Secondary | ICD-10-CM | POA: Diagnosis not present

## 2021-08-26 DIAGNOSIS — Z0000 Encounter for general adult medical examination without abnormal findings: Secondary | ICD-10-CM | POA: Diagnosis not present

## 2021-08-26 DIAGNOSIS — Z139 Encounter for screening, unspecified: Secondary | ICD-10-CM | POA: Diagnosis not present

## 2021-08-26 DIAGNOSIS — E782 Mixed hyperlipidemia: Secondary | ICD-10-CM | POA: Diagnosis not present

## 2021-08-26 DIAGNOSIS — Z1331 Encounter for screening for depression: Secondary | ICD-10-CM | POA: Diagnosis not present

## 2021-08-26 DIAGNOSIS — E1165 Type 2 diabetes mellitus with hyperglycemia: Secondary | ICD-10-CM | POA: Diagnosis not present

## 2021-08-26 DIAGNOSIS — Z136 Encounter for screening for cardiovascular disorders: Secondary | ICD-10-CM | POA: Diagnosis not present

## 2021-08-26 DIAGNOSIS — F3130 Bipolar disorder, current episode depressed, mild or moderate severity, unspecified: Secondary | ICD-10-CM | POA: Diagnosis not present

## 2021-09-01 DIAGNOSIS — H26493 Other secondary cataract, bilateral: Secondary | ICD-10-CM | POA: Diagnosis not present

## 2021-09-01 DIAGNOSIS — H33321 Round hole, right eye: Secondary | ICD-10-CM | POA: Diagnosis not present

## 2021-10-08 DIAGNOSIS — R0981 Nasal congestion: Secondary | ICD-10-CM | POA: Diagnosis not present

## 2021-10-08 DIAGNOSIS — J324 Chronic pansinusitis: Secondary | ICD-10-CM | POA: Diagnosis not present

## 2021-10-19 DIAGNOSIS — G8929 Other chronic pain: Secondary | ICD-10-CM | POA: Diagnosis not present

## 2021-10-19 DIAGNOSIS — G629 Polyneuropathy, unspecified: Secondary | ICD-10-CM | POA: Diagnosis not present

## 2021-10-19 DIAGNOSIS — M545 Low back pain, unspecified: Secondary | ICD-10-CM | POA: Diagnosis not present

## 2021-10-19 DIAGNOSIS — Z6826 Body mass index (BMI) 26.0-26.9, adult: Secondary | ICD-10-CM | POA: Diagnosis not present

## 2021-10-31 IMAGING — CT CT ABD-PELV W/ CM
2 of 5 series · 16 of 46 positions shown, 18 images · IV contrast (Omnipaque)
Comparison: CT abdomen and pelvis 05/24/2011; x-ray chest
03/08/2010.

CLINICAL DATA: Patient complains of epigastric pain, nausea and
diarrhea x 3-4 months. Hiatal hernia with GERD.

EXAM:
CT ABDOMEN AND PELVIS WITH CONTRAST
TECHNIQUE: Multidetector CT imaging of the abdomen and pelvis was performed
using the standard protocol following bolus administration of
intravenous contrast.
CONTRAST:  100mL OMNIPAQUE IOHEXOL 300 MG/ML  SOLN

[Series 2: axial st · axial · 0.91mm/px · z∈[-458,-42]mm · 13 of 93 slices shown, 15 images]
[im 5/93  soft-tissue]
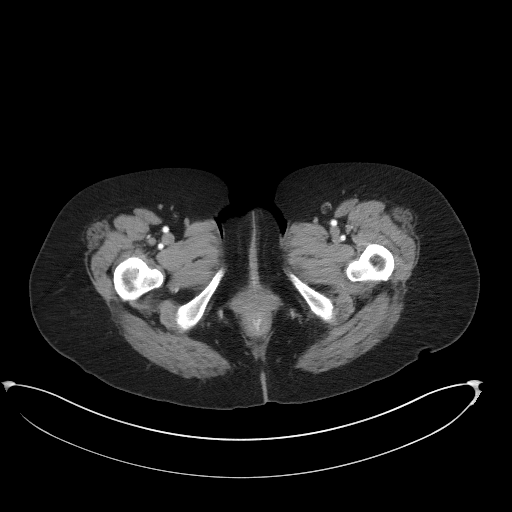
[im 5/93  bone]
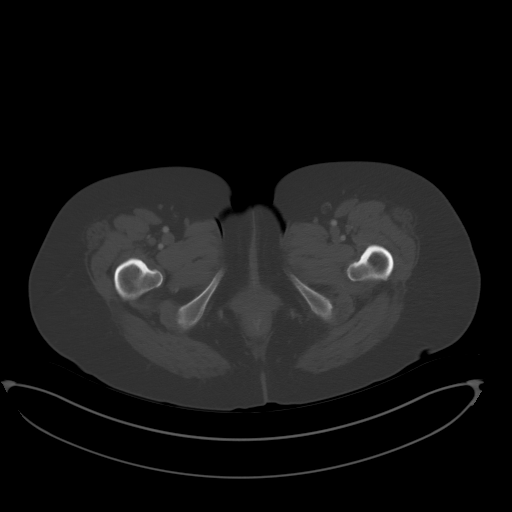
[im 14/93  soft-tissue]
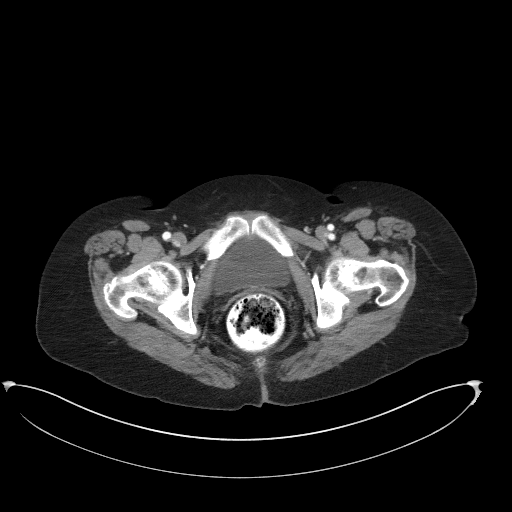
[im 19/93  soft-tissue]
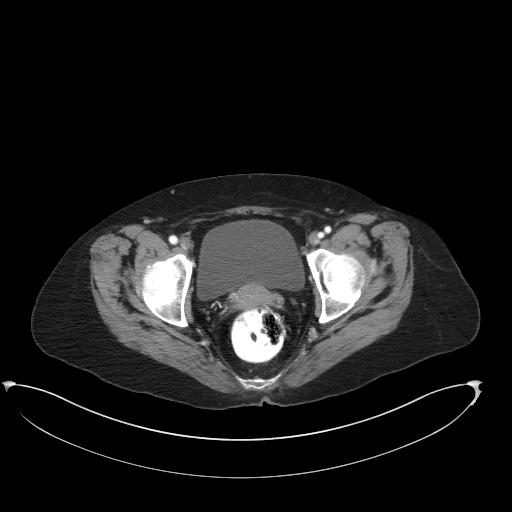
[im 28/93  soft-tissue]
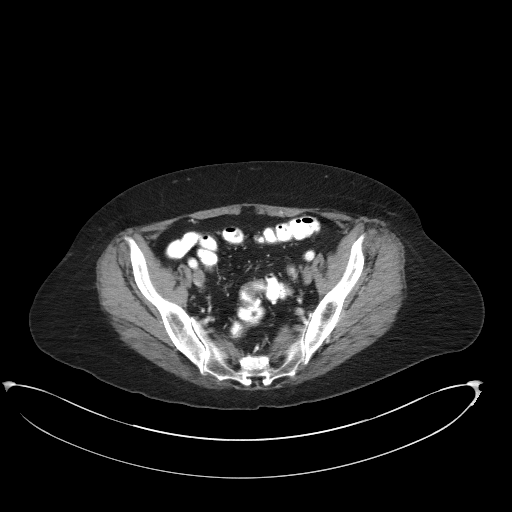
[im 33/93  soft-tissue]
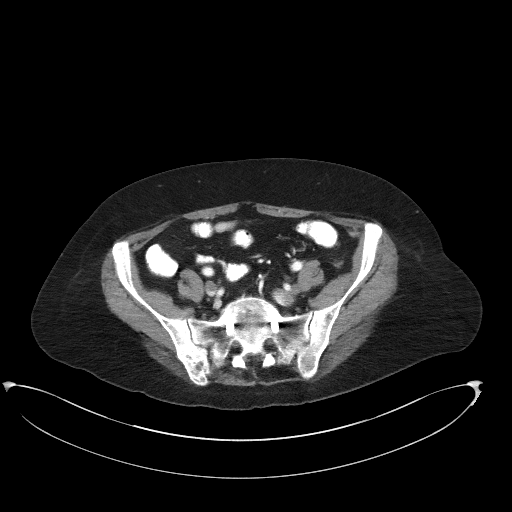
[im 42/93  soft-tissue]
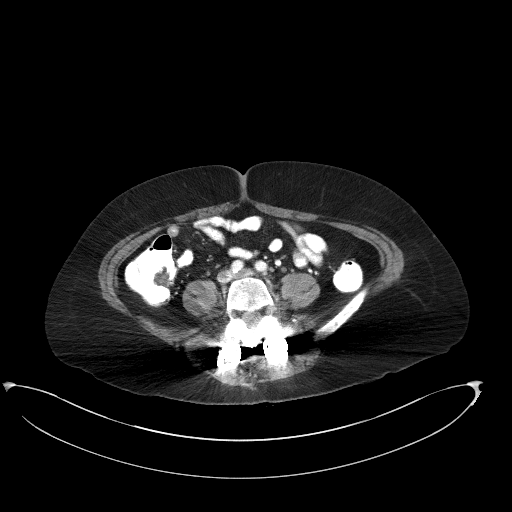
[im 47/93  soft-tissue]
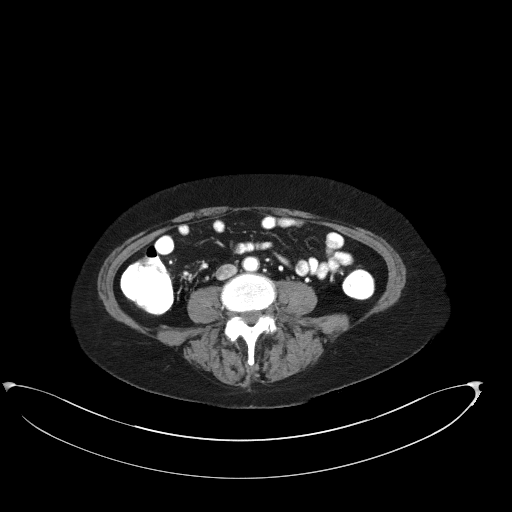
[im 51/93  soft-tissue]
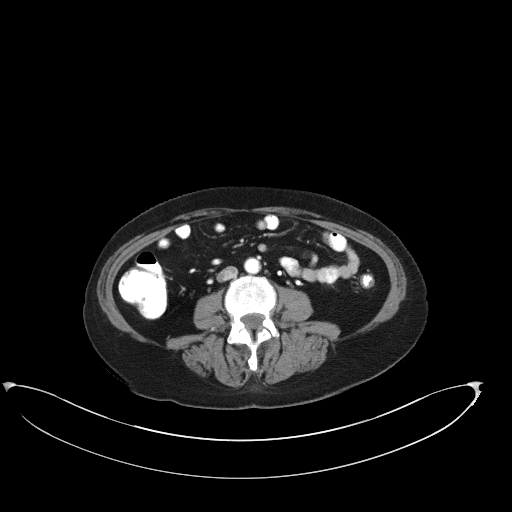
[im 60/93  soft-tissue]
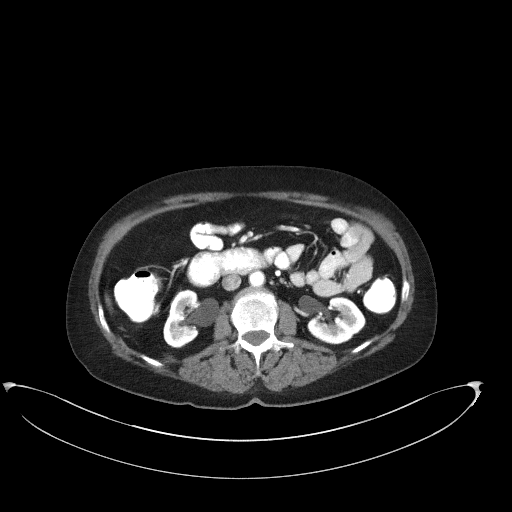
[im 60/93  bone]
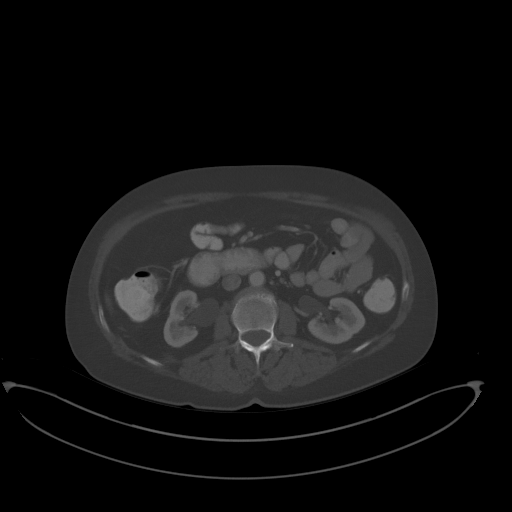
[im 65/93  soft-tissue]
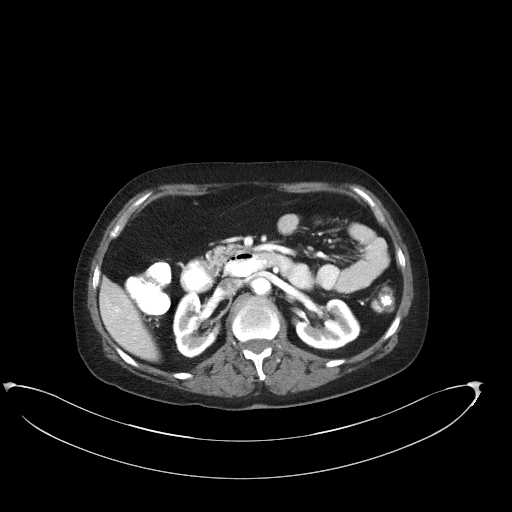
[im 74/93  soft-tissue]
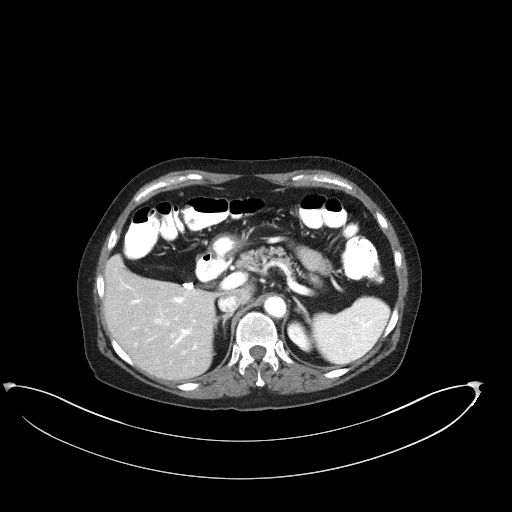
[im 79/93  soft-tissue]
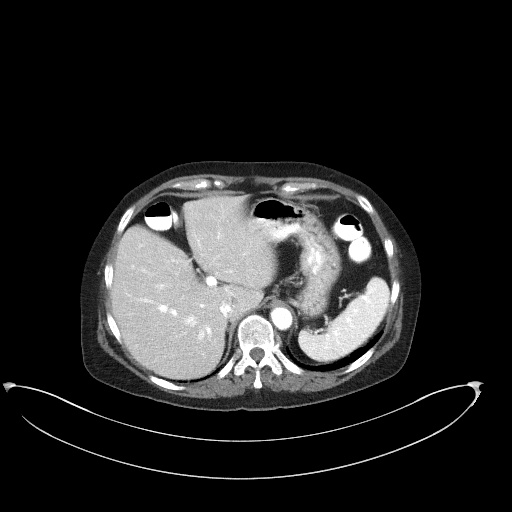
[im 88/93  soft-tissue]
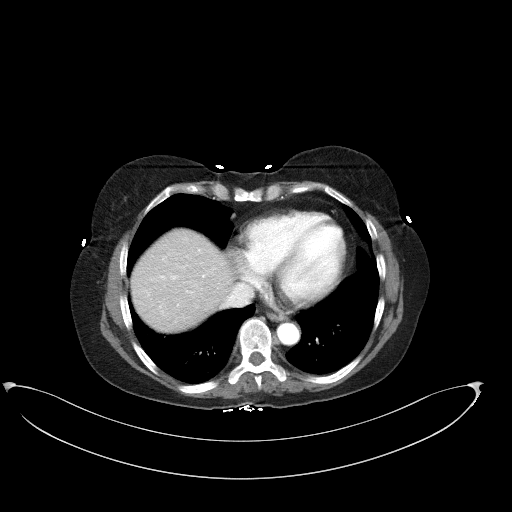

[Series 5: coronal st · coronal · 0.76mm/px · 3 of 79 slices shown]
[im 27/79  soft-tissue]
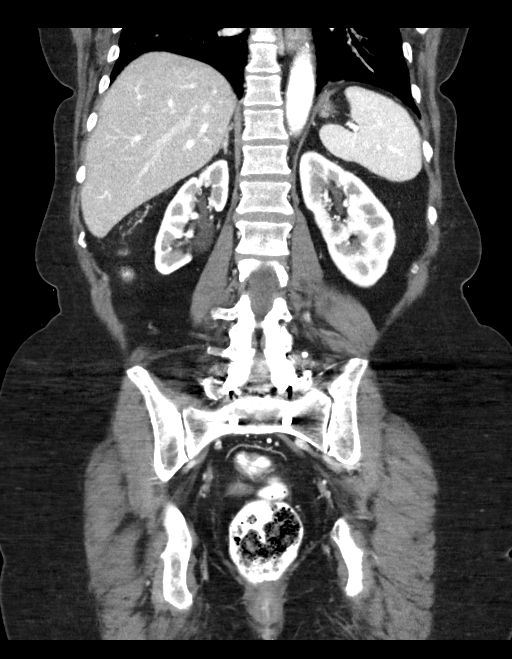
[im 35/79  soft-tissue]
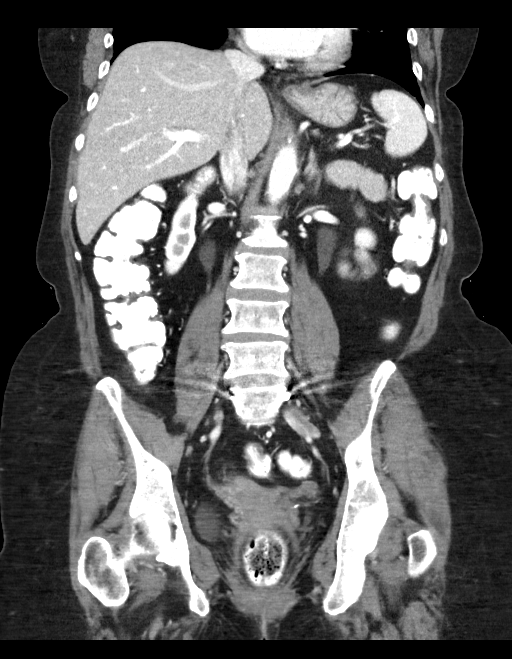
[im 44/79  soft-tissue]
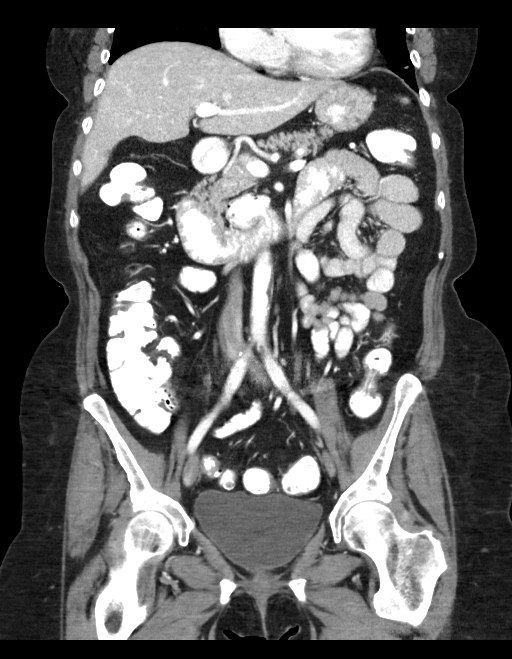

[16 of 46 positions shown; findings below may reference images not displayed]

FINDINGS: Lower chest: No acute abnormality.

Hepatobiliary: No focal liver abnormality is seen. Status post
cholecystectomy. No biliary dilatation.

Pancreas: Unremarkable. No pancreatic ductal dilatation or
surrounding inflammatory changes.

Spleen: Normal in size without focal abnormality.

Adrenals/Urinary Tract: Normal adrenal glands. No kidney mass,
nephrolithiasis, or hydronephrosis. Bilateral extra renal pelvis
identified. Urinary bladder is unremarkable.

Stomach/Bowel: The stomach appears normal. The appendix is not
visualized. No pericecal inflammation noted. No bowel wall
thickening, inflammation, or distension.

Vascular/Lymphatic: Aortic atherosclerosis. No aneurysm. No
abdominopelvic adenopathy identified.

Reproductive: Uterus and bilateral adnexa are unremarkable.

Other: No free fluid or fluid collections. No abdominal wall hernia.

Musculoskeletal: No acute or significant osseous findings. Posterior
hardware fusion of L5-S1 noted.
IMPRESSION: 1. No acute findings within the abdomen or pelvis.
2. Aortic Atherosclerosis ([id number]).

## 2021-11-02 DIAGNOSIS — Z6826 Body mass index (BMI) 26.0-26.9, adult: Secondary | ICD-10-CM | POA: Diagnosis not present

## 2021-11-02 DIAGNOSIS — F330 Major depressive disorder, recurrent, mild: Secondary | ICD-10-CM | POA: Diagnosis not present

## 2021-11-02 DIAGNOSIS — G629 Polyneuropathy, unspecified: Secondary | ICD-10-CM | POA: Diagnosis not present

## 2021-12-07 ENCOUNTER — Other Ambulatory Visit: Payer: Self-pay | Admitting: Gastroenterology

## 2021-12-28 DIAGNOSIS — E782 Mixed hyperlipidemia: Secondary | ICD-10-CM | POA: Diagnosis not present

## 2021-12-28 DIAGNOSIS — R7303 Prediabetes: Secondary | ICD-10-CM | POA: Diagnosis not present

## 2022-01-06 DIAGNOSIS — Z6826 Body mass index (BMI) 26.0-26.9, adult: Secondary | ICD-10-CM | POA: Diagnosis not present

## 2022-01-06 DIAGNOSIS — E782 Mixed hyperlipidemia: Secondary | ICD-10-CM | POA: Diagnosis not present

## 2022-01-06 DIAGNOSIS — E1165 Type 2 diabetes mellitus with hyperglycemia: Secondary | ICD-10-CM | POA: Diagnosis not present

## 2022-01-06 DIAGNOSIS — M25551 Pain in right hip: Secondary | ICD-10-CM | POA: Diagnosis not present

## 2022-01-20 DIAGNOSIS — G43009 Migraine without aura, not intractable, without status migrainosus: Secondary | ICD-10-CM | POA: Diagnosis not present

## 2022-01-20 DIAGNOSIS — G2581 Restless legs syndrome: Secondary | ICD-10-CM | POA: Diagnosis not present

## 2022-01-20 DIAGNOSIS — G479 Sleep disorder, unspecified: Secondary | ICD-10-CM | POA: Diagnosis not present

## 2022-04-19 DIAGNOSIS — J324 Chronic pansinusitis: Secondary | ICD-10-CM | POA: Diagnosis not present

## 2022-04-19 DIAGNOSIS — R519 Headache, unspecified: Secondary | ICD-10-CM | POA: Diagnosis not present

## 2022-04-19 DIAGNOSIS — R0982 Postnasal drip: Secondary | ICD-10-CM | POA: Diagnosis not present

## 2022-04-28 ENCOUNTER — Encounter: Payer: Self-pay | Admitting: Gastroenterology

## 2022-05-03 DIAGNOSIS — E782 Mixed hyperlipidemia: Secondary | ICD-10-CM | POA: Diagnosis not present

## 2022-05-03 DIAGNOSIS — Z1231 Encounter for screening mammogram for malignant neoplasm of breast: Secondary | ICD-10-CM | POA: Diagnosis not present

## 2022-05-03 DIAGNOSIS — E1165 Type 2 diabetes mellitus with hyperglycemia: Secondary | ICD-10-CM | POA: Diagnosis not present

## 2022-05-12 DIAGNOSIS — Z6827 Body mass index (BMI) 27.0-27.9, adult: Secondary | ICD-10-CM | POA: Diagnosis not present

## 2022-05-12 DIAGNOSIS — E782 Mixed hyperlipidemia: Secondary | ICD-10-CM | POA: Diagnosis not present

## 2022-05-12 DIAGNOSIS — Z23 Encounter for immunization: Secondary | ICD-10-CM | POA: Diagnosis not present

## 2022-05-12 DIAGNOSIS — E1165 Type 2 diabetes mellitus with hyperglycemia: Secondary | ICD-10-CM | POA: Diagnosis not present

## 2022-05-12 DIAGNOSIS — F330 Major depressive disorder, recurrent, mild: Secondary | ICD-10-CM | POA: Diagnosis not present

## 2022-08-23 DIAGNOSIS — G479 Sleep disorder, unspecified: Secondary | ICD-10-CM | POA: Diagnosis not present

## 2022-08-23 DIAGNOSIS — G2581 Restless legs syndrome: Secondary | ICD-10-CM | POA: Diagnosis not present

## 2022-08-23 DIAGNOSIS — G43009 Migraine without aura, not intractable, without status migrainosus: Secondary | ICD-10-CM | POA: Diagnosis not present

## 2022-09-13 DIAGNOSIS — E1165 Type 2 diabetes mellitus with hyperglycemia: Secondary | ICD-10-CM | POA: Diagnosis not present

## 2022-09-13 DIAGNOSIS — E782 Mixed hyperlipidemia: Secondary | ICD-10-CM | POA: Diagnosis not present

## 2022-09-22 DIAGNOSIS — Z1331 Encounter for screening for depression: Secondary | ICD-10-CM | POA: Diagnosis not present

## 2022-09-22 DIAGNOSIS — F330 Major depressive disorder, recurrent, mild: Secondary | ICD-10-CM | POA: Diagnosis not present

## 2022-09-22 DIAGNOSIS — Z1339 Encounter for screening examination for other mental health and behavioral disorders: Secondary | ICD-10-CM | POA: Diagnosis not present

## 2022-09-22 DIAGNOSIS — Z6827 Body mass index (BMI) 27.0-27.9, adult: Secondary | ICD-10-CM | POA: Diagnosis not present

## 2022-09-22 DIAGNOSIS — E1169 Type 2 diabetes mellitus with other specified complication: Secondary | ICD-10-CM | POA: Diagnosis not present

## 2022-09-22 DIAGNOSIS — Z1389 Encounter for screening for other disorder: Secondary | ICD-10-CM | POA: Diagnosis not present

## 2022-09-22 DIAGNOSIS — Z0000 Encounter for general adult medical examination without abnormal findings: Secondary | ICD-10-CM | POA: Diagnosis not present

## 2022-09-22 DIAGNOSIS — E782 Mixed hyperlipidemia: Secondary | ICD-10-CM | POA: Diagnosis not present

## 2022-09-22 DIAGNOSIS — Z136 Encounter for screening for cardiovascular disorders: Secondary | ICD-10-CM | POA: Diagnosis not present

## 2022-09-22 DIAGNOSIS — Z139 Encounter for screening, unspecified: Secondary | ICD-10-CM | POA: Diagnosis not present

## 2023-01-16 DIAGNOSIS — E1169 Type 2 diabetes mellitus with other specified complication: Secondary | ICD-10-CM | POA: Diagnosis not present

## 2023-01-16 DIAGNOSIS — E782 Mixed hyperlipidemia: Secondary | ICD-10-CM | POA: Diagnosis not present

## 2023-01-24 DIAGNOSIS — E1169 Type 2 diabetes mellitus with other specified complication: Secondary | ICD-10-CM | POA: Diagnosis not present

## 2023-01-24 DIAGNOSIS — Z6827 Body mass index (BMI) 27.0-27.9, adult: Secondary | ICD-10-CM | POA: Diagnosis not present

## 2023-01-24 DIAGNOSIS — F3130 Bipolar disorder, current episode depressed, mild or moderate severity, unspecified: Secondary | ICD-10-CM | POA: Diagnosis not present

## 2023-01-24 DIAGNOSIS — E782 Mixed hyperlipidemia: Secondary | ICD-10-CM | POA: Diagnosis not present

## 2023-01-24 DIAGNOSIS — Z23 Encounter for immunization: Secondary | ICD-10-CM | POA: Diagnosis not present

## 2023-01-24 DIAGNOSIS — Z139 Encounter for screening, unspecified: Secondary | ICD-10-CM | POA: Diagnosis not present

## 2023-01-24 DIAGNOSIS — F339 Major depressive disorder, recurrent, unspecified: Secondary | ICD-10-CM | POA: Diagnosis not present

## 2023-02-21 DIAGNOSIS — G479 Sleep disorder, unspecified: Secondary | ICD-10-CM | POA: Diagnosis not present

## 2023-02-21 DIAGNOSIS — G43009 Migraine without aura, not intractable, without status migrainosus: Secondary | ICD-10-CM | POA: Diagnosis not present

## 2023-02-21 DIAGNOSIS — G2581 Restless legs syndrome: Secondary | ICD-10-CM | POA: Diagnosis not present

## 2023-05-23 DIAGNOSIS — E782 Mixed hyperlipidemia: Secondary | ICD-10-CM | POA: Diagnosis not present

## 2023-05-23 DIAGNOSIS — E1169 Type 2 diabetes mellitus with other specified complication: Secondary | ICD-10-CM | POA: Diagnosis not present

## 2023-05-29 DIAGNOSIS — R7303 Prediabetes: Secondary | ICD-10-CM | POA: Diagnosis not present

## 2023-05-29 DIAGNOSIS — F339 Major depressive disorder, recurrent, unspecified: Secondary | ICD-10-CM | POA: Diagnosis not present

## 2023-05-29 DIAGNOSIS — E782 Mixed hyperlipidemia: Secondary | ICD-10-CM | POA: Diagnosis not present

## 2023-05-29 DIAGNOSIS — Z6827 Body mass index (BMI) 27.0-27.9, adult: Secondary | ICD-10-CM | POA: Diagnosis not present

## 2023-05-29 DIAGNOSIS — Z1331 Encounter for screening for depression: Secondary | ICD-10-CM | POA: Diagnosis not present

## 2023-05-29 DIAGNOSIS — Z9181 History of falling: Secondary | ICD-10-CM | POA: Diagnosis not present

## 2023-08-22 DIAGNOSIS — G479 Sleep disorder, unspecified: Secondary | ICD-10-CM | POA: Diagnosis not present

## 2023-08-22 DIAGNOSIS — G4733 Obstructive sleep apnea (adult) (pediatric): Secondary | ICD-10-CM | POA: Diagnosis not present

## 2023-08-22 DIAGNOSIS — G43009 Migraine without aura, not intractable, without status migrainosus: Secondary | ICD-10-CM | POA: Diagnosis not present

## 2023-08-22 DIAGNOSIS — G2581 Restless legs syndrome: Secondary | ICD-10-CM | POA: Diagnosis not present

## 2023-08-26 DIAGNOSIS — R0981 Nasal congestion: Secondary | ICD-10-CM | POA: Diagnosis not present

## 2023-08-26 DIAGNOSIS — R6883 Chills (without fever): Secondary | ICD-10-CM | POA: Diagnosis not present

## 2023-08-26 DIAGNOSIS — R070 Pain in throat: Secondary | ICD-10-CM | POA: Diagnosis not present

## 2023-09-25 DIAGNOSIS — E782 Mixed hyperlipidemia: Secondary | ICD-10-CM | POA: Diagnosis not present

## 2023-09-25 DIAGNOSIS — E1169 Type 2 diabetes mellitus with other specified complication: Secondary | ICD-10-CM | POA: Diagnosis not present

## 2023-10-06 DIAGNOSIS — Z1339 Encounter for screening examination for other mental health and behavioral disorders: Secondary | ICD-10-CM | POA: Diagnosis not present

## 2023-10-06 DIAGNOSIS — F339 Major depressive disorder, recurrent, unspecified: Secondary | ICD-10-CM | POA: Diagnosis not present

## 2023-10-06 DIAGNOSIS — E782 Mixed hyperlipidemia: Secondary | ICD-10-CM | POA: Diagnosis not present

## 2023-10-06 DIAGNOSIS — Z136 Encounter for screening for cardiovascular disorders: Secondary | ICD-10-CM | POA: Diagnosis not present

## 2023-10-06 DIAGNOSIS — Z1331 Encounter for screening for depression: Secondary | ICD-10-CM | POA: Diagnosis not present

## 2023-10-06 DIAGNOSIS — E1169 Type 2 diabetes mellitus with other specified complication: Secondary | ICD-10-CM | POA: Diagnosis not present

## 2023-10-06 DIAGNOSIS — Z6827 Body mass index (BMI) 27.0-27.9, adult: Secondary | ICD-10-CM | POA: Diagnosis not present

## 2023-10-06 DIAGNOSIS — Z139 Encounter for screening, unspecified: Secondary | ICD-10-CM | POA: Diagnosis not present

## 2023-10-06 DIAGNOSIS — Z0000 Encounter for general adult medical examination without abnormal findings: Secondary | ICD-10-CM | POA: Diagnosis not present

## 2023-12-20 DIAGNOSIS — Z9109 Other allergy status, other than to drugs and biological substances: Secondary | ICD-10-CM | POA: Diagnosis not present

## 2023-12-20 DIAGNOSIS — M545 Low back pain, unspecified: Secondary | ICD-10-CM | POA: Diagnosis not present

## 2023-12-20 DIAGNOSIS — Z780 Asymptomatic menopausal state: Secondary | ICD-10-CM | POA: Diagnosis not present

## 2023-12-20 DIAGNOSIS — E785 Hyperlipidemia, unspecified: Secondary | ICD-10-CM | POA: Diagnosis not present

## 2023-12-20 DIAGNOSIS — G43909 Migraine, unspecified, not intractable, without status migrainosus: Secondary | ICD-10-CM | POA: Diagnosis not present

## 2023-12-20 DIAGNOSIS — M8580 Other specified disorders of bone density and structure, unspecified site: Secondary | ICD-10-CM | POA: Diagnosis not present

## 2023-12-20 DIAGNOSIS — M1990 Unspecified osteoarthritis, unspecified site: Secondary | ICD-10-CM | POA: Diagnosis not present

## 2023-12-20 DIAGNOSIS — Z8709 Personal history of other diseases of the respiratory system: Secondary | ICD-10-CM | POA: Diagnosis not present

## 2023-12-20 DIAGNOSIS — R7303 Prediabetes: Secondary | ICD-10-CM | POA: Diagnosis not present

## 2023-12-20 DIAGNOSIS — G629 Polyneuropathy, unspecified: Secondary | ICD-10-CM | POA: Diagnosis not present

## 2023-12-20 DIAGNOSIS — Z7689 Persons encountering health services in other specified circumstances: Secondary | ICD-10-CM | POA: Diagnosis not present

## 2023-12-20 DIAGNOSIS — G8929 Other chronic pain: Secondary | ICD-10-CM | POA: Diagnosis not present

## 2024-01-09 DIAGNOSIS — M720 Palmar fascial fibromatosis [Dupuytren]: Secondary | ICD-10-CM | POA: Diagnosis not present

## 2024-02-21 DIAGNOSIS — G43009 Migraine without aura, not intractable, without status migrainosus: Secondary | ICD-10-CM | POA: Diagnosis not present

## 2024-02-21 DIAGNOSIS — G479 Sleep disorder, unspecified: Secondary | ICD-10-CM | POA: Diagnosis not present

## 2024-02-21 DIAGNOSIS — G2581 Restless legs syndrome: Secondary | ICD-10-CM | POA: Diagnosis not present

## 2024-02-26 ENCOUNTER — Ambulatory Visit (INDEPENDENT_AMBULATORY_CARE_PROVIDER_SITE_OTHER): Admitting: Allergy and Immunology

## 2024-02-26 ENCOUNTER — Encounter: Payer: Self-pay | Admitting: Allergy and Immunology

## 2024-02-26 VITALS — BP 110/64 | HR 60 | Resp 10 | Ht 60.0 in | Wt 140.2 lb

## 2024-02-26 DIAGNOSIS — H04123 Dry eye syndrome of bilateral lacrimal glands: Secondary | ICD-10-CM | POA: Diagnosis not present

## 2024-02-26 DIAGNOSIS — E559 Vitamin D deficiency, unspecified: Secondary | ICD-10-CM | POA: Diagnosis not present

## 2024-02-26 DIAGNOSIS — R7303 Prediabetes: Secondary | ICD-10-CM | POA: Diagnosis not present

## 2024-02-26 DIAGNOSIS — H1013 Acute atopic conjunctivitis, bilateral: Secondary | ICD-10-CM

## 2024-02-26 DIAGNOSIS — H1010 Acute atopic conjunctivitis, unspecified eye: Secondary | ICD-10-CM

## 2024-02-26 DIAGNOSIS — E785 Hyperlipidemia, unspecified: Secondary | ICD-10-CM | POA: Diagnosis not present

## 2024-02-26 DIAGNOSIS — J3089 Other allergic rhinitis: Secondary | ICD-10-CM | POA: Diagnosis not present

## 2024-02-26 DIAGNOSIS — J301 Allergic rhinitis due to pollen: Secondary | ICD-10-CM | POA: Diagnosis not present

## 2024-02-26 DIAGNOSIS — Z1329 Encounter for screening for other suspected endocrine disorder: Secondary | ICD-10-CM | POA: Diagnosis not present

## 2024-02-26 DIAGNOSIS — D519 Vitamin B12 deficiency anemia, unspecified: Secondary | ICD-10-CM | POA: Diagnosis not present

## 2024-02-26 DIAGNOSIS — M545 Low back pain, unspecified: Secondary | ICD-10-CM | POA: Diagnosis not present

## 2024-02-26 MED ORDER — IPRATROPIUM BROMIDE 0.06 % NA SOLN: NASAL | 5 refills | Status: AC

## 2024-02-26 NOTE — Patient Instructions (Addendum)
  1. Return to clinic without antihistamine use  2. Stop all oral antihistamine use. Do not rub eyes  3. Dry up nose with ipratropium 0.06% - 2 sprays each nostril every 6 hours if needed  4. Can use systane eye drops multiple times per day if needed  5. Can use Pataday - 1 drop each eye 1 time per day if needed  6. Can use Opcon-A eye drops if needed  7. Consider getting a set of sports glass to prevent pollen impact to your eye  8. Immunotherapy???  9. Influenza = Tamiflu. Covid = Paxlovid

## 2024-02-26 NOTE — Progress Notes (Unsigned)
  - [place] - [place]   Dear Duwaine,  Thank you for referring Jillian Franklin to the [facility] [place]  on 02/26/2024.   Below is a summation of this patient's evaluation and recommendations.  Thank you for your referral. I will keep you informed about this patient's response to treatment.   If you have any questions please do not hesitate to contact me.   Sincerely,  Camellia DOROTHA Denis, MD Allergy / Immunology [facility] [place]    ______________________________________________________________________    NEW PATIENT NOTE  Referring Provider: Duwaine Burnard Amble, NP Primary Provider: Duwaine Burnard Amble, NP Date of office visit: 02/26/2024    Subjective:   Chief Complaint:  Jillian Franklin (DOB: Jul 02, 1951) is a 73 y.o. female who presents to the clinic on 02/26/2024 with a chief complaint of Allergic Rhinitis  .     HPI: [name] presents to this clinic in evaluation of possible allergies.  She has lots of problems with her eyes.  Her eyes are always burning and there dripping and this is definitely much more prevalent during the spring and definitely appears to have a trigger of outdoor exposure.  During the spring she can even go outdoors without her eyes watering.  She has tried Opcon-A drops which helped somewhat.  She also has dry eye syndrome as told to her by her ophthalmologist and she uses Systane.  She has constant runny nose especially when she eats.  She does not have any nasal congestion or sneezing.  She is apparently been given a nose spray in the past but she cannot remember the name of that spray.  Past Medical History:  Diagnosis Date   Anxiety    Arthritis    Asthma    Barrett's esophagus    Depression    Diabetes (HCC)    GERD (gastroesophageal reflux disease)    HTN (hypertension)    Hypercholesteremia    IBS (irritable bowel syndrome)    Migraine     Neuropathy    Osteopenia    Seasonal allergies    Sleep apnea    CPAP Machine    Past Surgical History:  Procedure Laterality Date   CATARACT EXTRACTION Bilateral    may and june 2022   CHOLECYSTECTOMY     COLONOSCOPY  04/30/2012   Mild sigmoid diverticulosis. Small internal hermorrhoids. Otherwise normal colonoscopy to TI.    ESOPHAGOGASTRODUODENOSCOPY  10/05/2016   Small hiatal hernia. Mild gastritis.    KNEE SURGERY Right    SHOULDER SURGERY     SPINAL FUSION     TUBAL LIGATION      Allergies as of 02/26/2024       Reactions   Celecoxib Other (See Comments)   Neurontin [gabapentin] Other (See Comments)   Stomach pain   Citalopram Rash   Other reaction(s): Rash        Medication List    Alpha-Lipoic Acid 600 MG Tabs Take 1 tablet 3 times a day by oral route.   Cholecalciferol 125 MCG (5000 UT) Tabs Take by oral route.   Emgality 120 MG/ML Soaj Generic drug: Galcanezumab-gnlm   escitalopram 20 MG tablet Commonly known as: LEXAPRO Take 20 mg by mouth daily.   ipratropium 0.06 % nasal spray Commonly known as: ATROVENT  Can use two sprays in each nostril every six hours if needed to dry up runny nose. Started by: Verne Cove J Yisroel Mullendore   L-Lysine 500 MG  Caps Take by mouth daily.   pregabalin 50 MG capsule Commonly known as: LYRICA Take 50 mg by mouth 2 (two) times daily.   rizatriptan 10 MG tablet Commonly known as: MAXALT Take by mouth.   simvastatin 20 MG tablet Commonly known as: ZOCOR Take 20 mg by mouth at bedtime.   topiramate 200 MG tablet Commonly known as: TOPAMAX 1 tablet 2 (two) times daily.   VITAMIN-B COMPLEX PO Take 400 mg by mouth daily.   WOMENS MULTIVITAMIN PO Take by mouth daily.    Review of systems negative except as noted in HPI / PMHx or noted below:  Review of Systems  Constitutional: Negative.   HENT: Negative.    Eyes: Negative.   Respiratory: Negative.    Cardiovascular: Negative.   Gastrointestinal: Negative.    Genitourinary: Negative.   Musculoskeletal: Negative.   Skin: Negative.   Neurological: Negative.   Endo/Heme/Allergies: Negative.   Psychiatric/Behavioral: Negative.      Family History  Problem Relation Age of Onset   Breast cancer Mother    Breast cancer Maternal Grandmother    Breast cancer Maternal Aunt    Colon cancer Neg Hx    Esophageal cancer Neg Hx    Liver cancer Neg Hx     Social History   Socioeconomic History   Marital status: Married    Spouse name: Not on file   Number of children: Not on file   Years of education: Not on file   Highest education level: Not on file  Occupational History   Not on file  Tobacco Use   Smoking status: Never   Smokeless tobacco: Never  Vaping Use   Vaping status: Never Used  Substance and Sexual Activity   Alcohol  use: Not Currently   Drug use: Never   Sexual activity: Not on file  Other Topics Concern   Not on file  Social History Narrative   Not on file   Environmental and Social history  Lives in a house with a dry environment, no animals located inside the household, carpet in the bedroom, and plastic in the bed, no plastic on the pillow, no smoking ongoing with inside household. Objective:   Vitals:   02/26/24 1421  BP: 110/64  Pulse: 60  Resp: 10  SpO2: 95%   Height: 5' (152.4 cm) Weight: 140 lb 3.2 oz (63.6 kg)  Physical Exam Constitutional:      Appearance: She is not diaphoretic.  HENT:     Head: Normocephalic.     Right Ear: Tympanic membrane, ear canal and external ear normal.     Left Ear: Tympanic membrane, ear canal and external ear normal.     Nose: Nose normal. No mucosal edema or rhinorrhea.     Mouth/Throat:     Pharynx: Uvula midline. No oropharyngeal exudate.  Eyes:     Conjunctiva/sclera: Conjunctivae normal.  Neck:     Thyroid : No thyromegaly.     Trachea: Trachea normal. No tracheal tenderness or tracheal deviation.  Cardiovascular:     Rate and Rhythm: Normal rate and  regular rhythm.     Heart sounds: Normal heart sounds, S1 normal and S2 normal. No murmur heard. Pulmonary:     Effort: No respiratory distress.     Breath sounds: Normal breath sounds. No stridor. No wheezing or rales.  Lymphadenopathy:     Head:     Right side of head: No tonsillar adenopathy.     Left side of head: No tonsillar adenopathy.  Cervical: No cervical adenopathy.  Skin:    Findings: No erythema or rash.     Nails: There is no clubbing.  Neurological:     Mental Status: She is alert.     Diagnostics: Allergy skin tests were performed.   Spirometry was performed and demonstrated an FEV1 of *** @ *** % of predicted. FEV1/FVC = ***  The patient had an Asthma Control Test with the following results:  .     Assessment and Plan:    No diagnosis found.  Patient Instructions   1. Return to clinic without antihistamine use  2. Stop all oral antihistamine use. Do not rub eyes  3. Dry up nose with ipratropium 0.06% - 2 sprays each nostril every 6 hours if needed  4. Can use systane eye drops multiple times per day if needed  5. Can use Pataday - 1 drop each eye 1 time per day if needed  6. Can use Opcon-A eye drops if needed  7. Consider getting a set of sports glass to prevent pollen impact to your eye  8. Immunotherapy???  9. Influenza = Tamiflu. Covid = Paxlovid   Camellia DOROTHA Denis, MD Allergy / Immunology [facility] [place] 

## 2024-02-27 ENCOUNTER — Encounter: Payer: Self-pay | Admitting: Allergy and Immunology

## 2024-03-06 ENCOUNTER — Ambulatory Visit (INDEPENDENT_AMBULATORY_CARE_PROVIDER_SITE_OTHER): Admitting: Allergy and Immunology

## 2024-03-06 DIAGNOSIS — J301 Allergic rhinitis due to pollen: Secondary | ICD-10-CM | POA: Diagnosis not present

## 2024-03-07 ENCOUNTER — Encounter: Payer: Self-pay | Admitting: Allergy and Immunology

## 2024-03-07 NOTE — Progress Notes (Signed)
 Lyrica returns to this clinic for skin testing.  Allergy  skin testing identified hypersensitivity against Johnson grass.  Allergen avoidance measures were provided.

## 2024-03-12 ENCOUNTER — Encounter: Payer: Self-pay | Admitting: Gastroenterology

## 2024-03-20 DIAGNOSIS — M720 Palmar fascial fibromatosis [Dupuytren]: Secondary | ICD-10-CM | POA: Diagnosis not present

## 2024-03-22 DIAGNOSIS — G4700 Insomnia, unspecified: Secondary | ICD-10-CM | POA: Diagnosis not present

## 2024-03-22 DIAGNOSIS — F32A Depression, unspecified: Secondary | ICD-10-CM | POA: Diagnosis not present

## 2024-03-22 DIAGNOSIS — E785 Hyperlipidemia, unspecified: Secondary | ICD-10-CM | POA: Diagnosis not present

## 2024-03-22 DIAGNOSIS — G2581 Restless legs syndrome: Secondary | ICD-10-CM | POA: Diagnosis not present

## 2024-03-22 DIAGNOSIS — R7303 Prediabetes: Secondary | ICD-10-CM | POA: Diagnosis not present

## 2024-03-22 DIAGNOSIS — Z7189 Other specified counseling: Secondary | ICD-10-CM | POA: Diagnosis not present

## 2024-03-22 DIAGNOSIS — F419 Anxiety disorder, unspecified: Secondary | ICD-10-CM | POA: Diagnosis not present

## 2024-03-22 DIAGNOSIS — M545 Low back pain, unspecified: Secondary | ICD-10-CM | POA: Diagnosis not present

## 2024-03-22 DIAGNOSIS — G8929 Other chronic pain: Secondary | ICD-10-CM | POA: Diagnosis not present

## 2024-03-22 DIAGNOSIS — Z13228 Encounter for screening for other metabolic disorders: Secondary | ICD-10-CM | POA: Diagnosis not present

## 2024-03-22 DIAGNOSIS — G43909 Migraine, unspecified, not intractable, without status migrainosus: Secondary | ICD-10-CM | POA: Diagnosis not present

## 2024-03-22 DIAGNOSIS — G629 Polyneuropathy, unspecified: Secondary | ICD-10-CM | POA: Diagnosis not present

## 2024-03-22 DIAGNOSIS — M720 Palmar fascial fibromatosis [Dupuytren]: Secondary | ICD-10-CM | POA: Diagnosis not present

## 2024-03-25 DIAGNOSIS — M720 Palmar fascial fibromatosis [Dupuytren]: Secondary | ICD-10-CM | POA: Diagnosis not present

## 2024-03-29 DIAGNOSIS — M25642 Stiffness of left hand, not elsewhere classified: Secondary | ICD-10-CM | POA: Diagnosis not present

## 2024-03-29 DIAGNOSIS — M25542 Pain in joints of left hand: Secondary | ICD-10-CM | POA: Diagnosis not present

## 2024-04-03 ENCOUNTER — Ambulatory Visit: Admitting: Allergy and Immunology

## 2024-04-05 DIAGNOSIS — M720 Palmar fascial fibromatosis [Dupuytren]: Secondary | ICD-10-CM | POA: Diagnosis not present

## 2024-05-13 ENCOUNTER — Ambulatory Visit: Admitting: *Deleted

## 2024-05-13 VITALS — Ht 60.0 in | Wt 127.0 lb

## 2024-05-13 DIAGNOSIS — Z1211 Encounter for screening for malignant neoplasm of colon: Secondary | ICD-10-CM

## 2024-05-13 MED ORDER — NA SULFATE-K SULFATE-MG SULF 17.5-3.13-1.6 GM/177ML PO SOLN: 1.0000 | Freq: Once | ORAL | 0 refills | Status: AC

## 2024-05-13 NOTE — Progress Notes (Addendum)
 Pt's name and DOB verified at the beginning of the pre-visit with 2 identifiers  Pt denies any difficulty with ambulating,sitting, laying down or rolling side to side  Pt has no issues moving head neck or swallowing  No egg or soy allergy  known to patient   No issues known to pt with past sedation  No FH of Malignant Hyperthermia  Pt is not on home 02   Pt is not on blood thinners   Pt denies issues with constipation   Pt is not on dialysis  Pthx of heart murmur   Pt denies any upcoming cardiac testing  Patient's chart reviewed by Norleen Schillings CNRA prior to pre-visit and patient appropriate for the [facility].  Pre-visit completed and red dot placed by patient's name on their procedure day (on provider's schedule).    Visit in person  Pt states weight is 127 lb  Pt given  both [facility] main # and MD on call # prior to instructions.  Informed pt to come in at the time discussed and is shown on PV instructions.  Pt instructed to use Single[employer]com or GoodRx for a price reduction on prep  Instructed pt where to find PV instructions in My Ch. Copy of instructions given to pt Instructed pt on all aspects of written instructions including med holds clothing to wear and foods to eat and not eat as well as after procedure legal restrictions and to call MD on call if needed.. Pt states understanding. Instructed pt to review instructions again prior to procedure and call main # given if has any questions or any issues. Pt states they will.

## 2024-05-21 ENCOUNTER — Encounter: Payer: Self-pay | Admitting: Gastroenterology

## 2024-05-27 ENCOUNTER — Ambulatory Visit (AMBULATORY_SURGERY_CENTER): Admitting: Gastroenterology

## 2024-05-27 ENCOUNTER — Encounter: Payer: Self-pay | Admitting: Gastroenterology

## 2024-05-27 VITALS — BP 115/56 | HR 70 | Temp 97.9°F | Resp 12 | Ht 60.0 in | Wt 127.0 lb

## 2024-05-27 DIAGNOSIS — K648 Other hemorrhoids: Secondary | ICD-10-CM

## 2024-05-27 DIAGNOSIS — F32A Depression, unspecified: Secondary | ICD-10-CM | POA: Diagnosis not present

## 2024-05-27 DIAGNOSIS — K644 Residual hemorrhoidal skin tags: Secondary | ICD-10-CM | POA: Diagnosis not present

## 2024-05-27 DIAGNOSIS — D122 Benign neoplasm of ascending colon: Secondary | ICD-10-CM

## 2024-05-27 DIAGNOSIS — K5730 Diverticulosis of large intestine without perforation or abscess without bleeding: Secondary | ICD-10-CM

## 2024-05-27 DIAGNOSIS — G4730 Sleep apnea, unspecified: Secondary | ICD-10-CM | POA: Diagnosis not present

## 2024-05-27 DIAGNOSIS — F419 Anxiety disorder, unspecified: Secondary | ICD-10-CM | POA: Diagnosis not present

## 2024-05-27 DIAGNOSIS — D123 Benign neoplasm of transverse colon: Secondary | ICD-10-CM

## 2024-05-27 DIAGNOSIS — Z1211 Encounter for screening for malignant neoplasm of colon: Secondary | ICD-10-CM | POA: Diagnosis not present

## 2024-05-27 DIAGNOSIS — E119 Type 2 diabetes mellitus without complications: Secondary | ICD-10-CM | POA: Diagnosis not present

## 2024-05-27 MED ORDER — SODIUM CHLORIDE 0.9 % IV SOLN: 500.0000 mL | Freq: Once | INTRAVENOUS | Status: DC

## 2024-05-27 NOTE — Progress Notes (Signed)
 Report to PACU, RN, vss, BBS= Clear.

## 2024-05-27 NOTE — Progress Notes (Signed)
  Gastroenterology History and Physical   Primary Care Physician:  Duwaine Burnard Amble, NP   Reason for Procedure:   CRC screening  Plan:    colon   The patient was provided an opportunity to ask questions and all were answered. The patient agreed with the plan.   HPI: Jillian Franklin is a 73 y.o. female    Past Medical History:  Diagnosis Date   Anxiety    Arthritis    Asthma    Barrett's esophagus    Depression    Diabetes (HCC)    GERD (gastroesophageal reflux disease)    Heart murmur    Hypercholesteremia    IBS (irritable bowel syndrome)    Migraine    Neuropathy    Osteopenia    Seasonal allergies    Sleep apnea    CPAP Machine    Past Surgical History:  Procedure Laterality Date   CATARACT EXTRACTION Bilateral    may and june 2022   CHOLECYSTECTOMY     COLONOSCOPY  04/30/2012   Mild sigmoid diverticulosis. Small internal hermorrhoids. Otherwise normal colonoscopy to TI.    ESOPHAGOGASTRODUODENOSCOPY  10/05/2016   Small hiatal hernia. Mild gastritis.    KNEE SURGERY Right    SHOULDER SURGERY     SPINAL FUSION     TUBAL LIGATION      Prior to Admission medications   Medication Sig Start Date End Date Taking? Authorizing Provider  Alpha-Lipoic Acid 600 MG TABS Take 1 tablet 3 times a day by oral route.   Yes [provider]  Cholecalciferol 125 MCG (5000 UT) TABS Take by oral route.   Yes [provider]  EMGALITY 120 MG/ML SOAJ  01/31/24  Yes [provider]  escitalopram (LEXAPRO) 20 MG tablet Take 20 mg by mouth daily. 01/19/24  Yes [provider]  ipratropium (ATROVENT ) 0.06 % nasal spray Can use two sprays in each nostril every six hours if needed to dry up runny nose. 02/26/24  Yes Kozlow, Camellia PARAS, MD  L-Lysine 500 MG CAPS Take by mouth daily.   Yes [provider]  Multiple Vitamins-Minerals (WOMENS MULTIVITAMIN PO) Take by mouth daily.   Yes [provider]  Na Sulfate-K Sulfate-Mg Sulfate  concentrate (SUPREP) 17.5-3.13-1.6 GM/177ML SOLN as directed. 05/13/24  Yes [provider]  pregabalin (LYRICA) 50 MG capsule Take 50 mg by mouth 2 (two) times daily.   Yes [provider]  rizatriptan (MAXALT) 10 MG tablet Take by mouth. 02/21/24  Yes [provider]  simvastatin (ZOCOR) 20 MG tablet Take 20 mg by mouth at bedtime. 02/17/19  Yes [provider]  topiramate (TOPAMAX) 200 MG tablet 1 tablet 2 (two) times daily. 12/06/16  Yes [provider]  XIAFLEX 0.9 MG SOLR  03/18/24  Yes [provider]  B Complex Vitamins (VITAMIN-B COMPLEX PO) Take 400 mg by mouth daily. Patient not taking: Reported on 05/27/2024    [provider]  baclofen (LIORESAL) 10 MG tablet TAKE 1 TO 2 TABLETS BY MOUTH EVERY 6 TO 8 HOURS AS NEEDED FOR HEADACHE    [provider]    Current Outpatient Medications  Medication Sig Dispense Refill   Alpha-Lipoic Acid 600 MG TABS Take 1 tablet 3 times a day by oral route.     Cholecalciferol 125 MCG (5000 UT) TABS Take by oral route.     EMGALITY 120 MG/ML SOAJ      escitalopram (LEXAPRO) 20 MG tablet Take 20 mg by mouth daily.  ipratropium (ATROVENT ) 0.06 % nasal spray Can use two sprays in each nostril every six hours if needed to dry up runny nose. 15 mL 5   L-Lysine 500 MG CAPS Take by mouth daily.     Multiple Vitamins-Minerals (WOMENS MULTIVITAMIN PO) Take by mouth daily.     Na Sulfate-K Sulfate-Mg Sulfate concentrate (SUPREP) 17.5-3.13-1.6 GM/177ML SOLN as directed.     pregabalin (LYRICA) 50 MG capsule Take 50 mg by mouth 2 (two) times daily.     rizatriptan (MAXALT) 10 MG tablet Take by mouth.     simvastatin (ZOCOR) 20 MG tablet Take 20 mg by mouth at bedtime.     topiramate (TOPAMAX) 200 MG tablet 1 tablet 2 (two) times daily.     XIAFLEX 0.9 MG SOLR      B Complex Vitamins (VITAMIN-B COMPLEX PO) Take 400 mg by mouth daily. (Patient not taking: Reported on 05/27/2024)     baclofen  (LIORESAL) 10 MG tablet TAKE 1 TO 2 TABLETS BY MOUTH EVERY 6 TO 8 HOURS AS NEEDED FOR HEADACHE     Current Facility-Administered Medications  Medication Dose Route Frequency Provider Last Rate Last Admin   0.9 %  sodium chloride infusion  500 mL Intravenous Once Charlanne Groom, MD        Allergies as of 05/27/2024 - Review Complete 05/27/2024  Allergen Reaction Noted   Celecoxib Other (See Comments) 01/22/2014   Citalopram Rash 01/22/2014   Grass pollen(k-o-r-t-swt vern) Other (See Comments) 05/13/2024   Neurontin [gabapentin] Other (See Comments) 02/26/2024    Family History  Problem Relation Age of Onset   Breast cancer Mother    Breast cancer Maternal Aunt    Breast cancer Maternal Grandmother    Colon cancer Neg Hx    Esophageal cancer Neg Hx    Liver cancer Neg Hx    Colon polyps Neg Hx    Stomach cancer Neg Hx    Rectal cancer Neg Hx     Social History   Socioeconomic History   Marital status: Married    Spouse name: Not on file   Number of children: Not on file   Years of education: Not on file   Highest education level: Not on file  Occupational History   Not on file  Tobacco Use   Smoking status: Former    Types: Cigarettes   Smokeless tobacco: Never  Vaping Use   Vaping status: Never Used  Substance and Sexual Activity   Alcohol  use: Not Currently   Drug use: Never   Sexual activity: Not Currently    Birth control/protection: Post-menopausal, Surgical  Other Topics Concern   Not on file  Social History Narrative   Not on file   Social Drivers of Health   Financial Resource Strain: Not on file  Food Insecurity: Not on file  Transportation Needs: Not on file  Physical Activity: Not on file  Stress: Not on file  Social Connections: Not on file  Intimate Partner Violence: Not on file    Review of Systems: Positive for none All other review of systems negative except as mentioned in the HPI.  Physical Exam: Vital signs in last 24  hours: @VSRANGES @   General:   Alert,  Well-developed, well-nourished, pleasant and cooperative in NAD Lungs:  Clear throughout to auscultation.   Heart:  Regular rate and rhythm; no murmurs, clicks, rubs,  or gallops. Abdomen:  Soft, nontender and nondistended. Normal bowel sounds.   Neuro/Psych:  Alert and cooperative. Normal mood and  affect. A and O x 3    No significant changes were identified.  The patient continues to be an appropriate candidate for the planned procedure and anesthesia.   Anselm Bring, MD. [facility] 05/27/2024 10:47 AM@

## 2024-05-27 NOTE — Progress Notes (Signed)
 Pt's states no medical or surgical changes since previsit or office visit.

## 2024-05-27 NOTE — Patient Instructions (Signed)
 Resume previous diet and medications. Awaiting pathology results. Repeat Colonoscopy date to be determined based on pathology results. Handouts provided on colon polyps and Diverticulosis  YOU HAD AN ENDOSCOPIC PROCEDURE TODAY AT THE [facility]:   Refer to the procedure report that was given to you for any specific questions about what was found during the examination.  If the procedure report does not answer your questions, please call your gastroenterologist to clarify.  If you requested that your care partner not be given the details of your procedure findings, then the procedure report has been included in a sealed envelope for you to review at your convenience later.  YOU SHOULD EXPECT: Some feelings of bloating in the abdomen. Passage of more gas than usual.  Walking can help get rid of the air that was put into your GI tract during the procedure and reduce the bloating. If you had a lower endoscopy (such as a colonoscopy or flexible sigmoidoscopy) you may notice spotting of blood in your stool or on the toilet paper. If you underwent a bowel prep for your procedure, you may not have a normal bowel movement for a few days.  Please Note:  You might notice some irritation and congestion in your nose or some drainage.  This is from the oxygen used during your procedure.  There is no need for concern and it should clear up in a day or so.  SYMPTOMS TO REPORT IMMEDIATELY:  Following lower endoscopy (colonoscopy or flexible sigmoidoscopy):  Excessive amounts of blood in the stool  Significant tenderness or worsening of abdominal pains  Swelling of the abdomen that is new, acute  Fever of 100F or higher  For urgent or emergent issues, a gastroenterologist can be reached at any hour by calling [phone]. Do not use MyChart messaging for urgent concerns.    DIET:  We do recommend a small meal at first, but then you may proceed to your regular diet.  Drink plenty of fluids but  you should avoid alcoholic beverages for 24 hours.  ACTIVITY:  You should plan to take it easy for the rest of today and you should NOT DRIVE or use heavy machinery until tomorrow (because of the sedation medicines used during the test).    FOLLOW UP: Our staff will call the number listed on your records the next business day following your procedure.  We will call around 7:15- 8:00 am to check on you and address any questions or concerns that you may have regarding the information given to you following your procedure. If we do not reach you, we will leave a message.     If any biopsies were taken you will be contacted by phone or by letter within the next 1-3 weeks.  Please call us at [phone] if you have not heard about the biopsies in 3 weeks.    SIGNATURES/CONFIDENTIALITY: You and/or your care partner have signed paperwork which will be entered into your electronic medical record.  These signatures attest to the fact that that the information above on your After Visit Summary has been reviewed and is understood.  Full responsibility of the confidentiality of this discharge information lies with you and/or your care-partner.

## 2024-05-27 NOTE — Op Note (Signed)
 [facility] Patient Name: Jillian Franklin Procedure Date: 05/27/2024 10:48 AM MRN: [id number] Endoscopist: Lynnie Bring , MD, [id number] Age: 73 Referring MD:  Date of Birth: 30-Jun-1951 Gender: Female Account #: [id number] Procedure:                Colonoscopy Indications:              Screening for colorectal malignant neoplasm.                            Previous colonoscopy 2013 with negative random                            colon biopsies. Medicines:                Monitored Anesthesia Care Procedure:                Pre-Anesthesia Assessment:                           - Prior to the procedure, a History and Physical                            was performed, and patient medications and                            allergies were reviewed. The patient's tolerance of                            previous anesthesia was also reviewed. The risks                            and benefits of the procedure and the sedation                            options and risks were discussed with the patient.                            All questions were answered, and informed consent                            was obtained. Prior Anticoagulants: The patient has                            taken no anticoagulant or antiplatelet agents. ASA                            Grade Assessment: II - A patient with mild systemic                            disease. After reviewing the risks and benefits,                            the patient was deemed in satisfactory condition to  undergo the procedure.                           After obtaining informed consent, the colonoscope                            was passed under direct vision. Throughout the                            procedure, the patient's blood pressure, pulse, and                            oxygen saturations were monitored continuously. The                            Olympus Scope PCF [id number] was introduced through                             the anus and advanced to the 2 cm into the ileum.                            The colonoscopy was performed without difficulty.                            The patient tolerated the procedure well. The                            quality of the bowel preparation was good. The                            terminal ileum, ileocecal valve, appendiceal                            orifice, and rectum were photographed. Scope In: 10:55:23 AM Scope Out: 11:09:32 AM Scope Withdrawal Time: 0 hours 10 minutes 29 seconds  Total Procedure Duration: 0 hours 14 minutes 9 seconds  Findings:                 Two sessile polyps were found in the mid transverse                            colon and distal ascending colon. The polyps were 2                            to 8 mm in size. These polyps were removed with a                            cold snare. Resection and retrieval were complete.                           A few medium-mouthed diverticula were found in the                            sigmoid colon  and descending colon.                           Non-bleeding external and internal hemorrhoids were                            found during retroflexion and during perianal exam.                            The hemorrhoids were small and Grade I (internal                            hemorrhoids that do not prolapse).                           The terminal ileum appeared normal.                           Retroflexion in the right colon was performed.                           The exam was otherwise without abnormality on                            direct and retroflexion views. Complications:            No immediate complications. Estimated Blood Loss:     Estimated blood loss: none. Impression:               - Two 2 to 8 mm polyps in the mid transverse colon                            and in the distal ascending colon, removed with a                            cold snare. Resected and  retrieved.                           - Mild left colonic diverticulosis.                           - Non-bleeding external and internal hemorrhoids.                           - The examined portion of the ileum was normal.                           - The examination was otherwise normal on direct                            and retroflexion views. Recommendation:           - Patient has a contact number available for                            emergencies. The signs and symptoms of  potential                            delayed complications were discussed with the                            patient. Return to normal activities tomorrow.                            Written discharge instructions were provided to the                            patient.                           - Resume previous diet.                           - Continue present medications.                           - Await pathology results.                           - Repeat colonoscopy for surveillance based on                            pathology results. Likely not needed due to age.                           - The findings and recommendations were discussed                            with the patient's family. Lynnie Bring, MD 05/27/2024 11:15:09 AM This report has been signed electronically.

## 2024-05-27 NOTE — Progress Notes (Signed)
 Called to room to assist during endoscopic procedure.  Patient ID and intended procedure confirmed with present staff. Received instructions for my participation in the procedure from the performing physician.

## 2024-05-28 ENCOUNTER — Telehealth: Payer: Self-pay

## 2024-05-28 NOTE — Telephone Encounter (Signed)
  Follow up Call-     05/27/2024   10:10 AM  Call back number  Post procedure Call Back phone  # [phone]  Permission to leave phone message Yes     Patient questions:  Do you have a fever, pain , or abdominal swelling? No. Pain Score  0 *  Have you tolerated food without any problems? Yes.    Have you been able to return to your normal activities? Yes.    Do you have any questions about your discharge instructions: Diet   No. Medications  No. Follow up visit  No.  Do you have questions or concerns about your Care? No.  Actions: * If pain score is 4 or above: No action needed, pain <4.

## 2024-05-29 LAB — SURGICAL PATHOLOGY

## 2024-06-01 ENCOUNTER — Ambulatory Visit: Payer: Self-pay | Admitting: Gastroenterology

## 2024-06-21 DIAGNOSIS — Z13228 Encounter for screening for other metabolic disorders: Secondary | ICD-10-CM | POA: Diagnosis not present

## 2024-06-21 DIAGNOSIS — E559 Vitamin D deficiency, unspecified: Secondary | ICD-10-CM | POA: Diagnosis not present

## 2024-06-21 DIAGNOSIS — F418 Other specified anxiety disorders: Secondary | ICD-10-CM | POA: Diagnosis not present
